# Patient Record
Sex: Female | Born: 1980 | Race: White | Hispanic: No | Marital: Single | Smoking: Current every day smoker
Health system: Southern US, Community
[De-identification: ages and names within clinical notes are randomized; demographics above are authoritative.]

## PROBLEM LIST (undated history)

## (undated) DIAGNOSIS — J45909 Unspecified asthma, uncomplicated: Secondary | ICD-10-CM

## (undated) DIAGNOSIS — F419 Anxiety disorder, unspecified: Secondary | ICD-10-CM

## (undated) DIAGNOSIS — M797 Fibromyalgia: Secondary | ICD-10-CM

## (undated) DIAGNOSIS — G039 Meningitis, unspecified: Secondary | ICD-10-CM

## (undated) DIAGNOSIS — A6920 Lyme disease, unspecified: Secondary | ICD-10-CM

## (undated) HISTORY — DX: Lyme disease, unspecified: A69.20

## (undated) HISTORY — DX: Meningitis, unspecified: G03.9

## (undated) HISTORY — DX: Fibromyalgia: M79.7

## (undated) HISTORY — PX: CHOLECYSTECTOMY: SHX55

---

## 2007-10-09 ENCOUNTER — Emergency Department: Payer: Self-pay | Admitting: Internal Medicine

## 2007-10-09 ENCOUNTER — Other Ambulatory Visit: Payer: Self-pay

## 2009-04-25 DIAGNOSIS — M549 Dorsalgia, unspecified: Secondary | ICD-10-CM | POA: Insufficient documentation

## 2011-05-27 ENCOUNTER — Emergency Department: Payer: Self-pay | Admitting: Emergency Medicine

## 2011-10-23 ENCOUNTER — Ambulatory Visit: Payer: Self-pay | Admitting: Emergency Medicine

## 2011-10-26 ENCOUNTER — Ambulatory Visit: Payer: Self-pay | Admitting: Emergency Medicine

## 2011-10-27 LAB — PATHOLOGY REPORT

## 2012-05-26 ENCOUNTER — Emergency Department: Payer: Self-pay | Admitting: Emergency Medicine

## 2012-05-28 ENCOUNTER — Emergency Department: Payer: Self-pay | Admitting: Unknown Physician Specialty

## 2012-08-10 IMAGING — CT CT ABD-PELV W/ CM
1 of 2 series · 15 of 32 positions shown, 19 images · non-contrast
Comparison: none

REASON FOR EXAM: (1) LLQ pain vomiting, diarrhea; (2) LLQ pain vomiting,
diarrhea;    NOTE: Nursi
COMMENTS:

PROCEDURE:     CT  - CT ABDOMEN / PELVIS  W  - May 28, 2011  [DATE]
RESULT:
TECHNIQUE: Emergent CT of the abdomen and pelvis is performed with 100 ml of
Esovue-01U iodinated intravenous contrast and oral contrast. Images are
reconstructed in the axial plane at 3.0 mm slice thickness.
There is no previous exam for comparison.

[Series 2: 3mm soft tissue · axial · 0.76mm/px · z∈[-872,-449]mm · 15 of 155 slices shown, 19 images]
[im 7/155  soft-tissue]
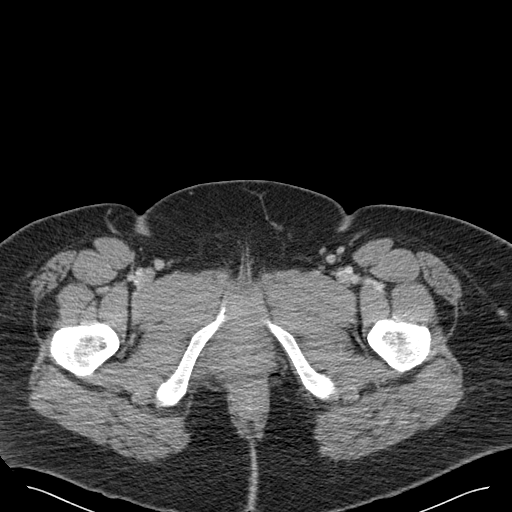
[im 7/155  bone]
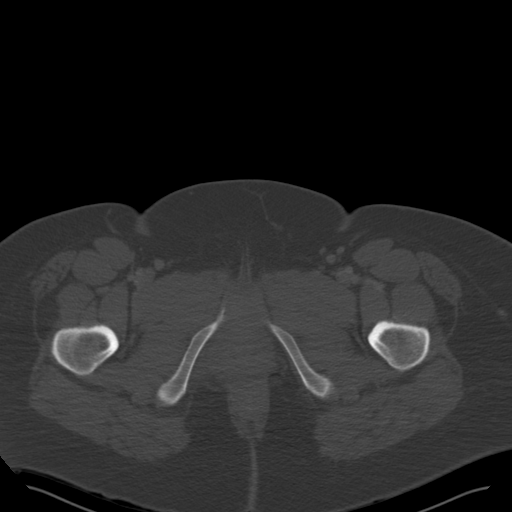
[im 19/155  soft-tissue]
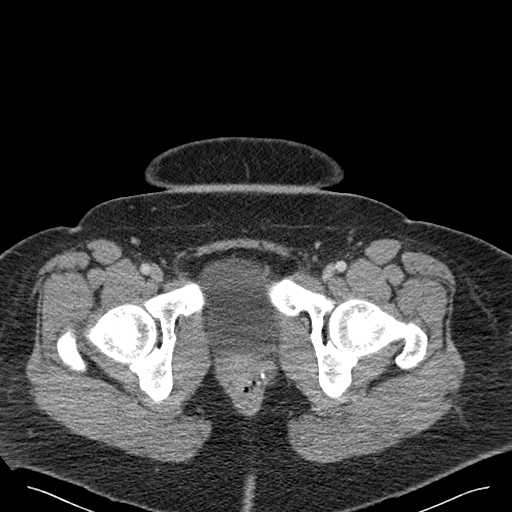
[im 31/155  soft-tissue]
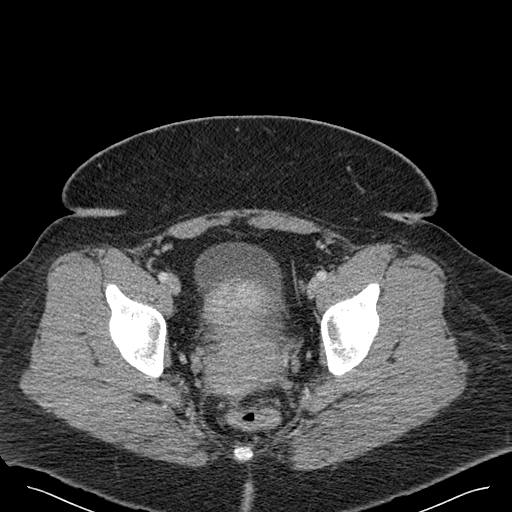
[im 44/155  soft-tissue]
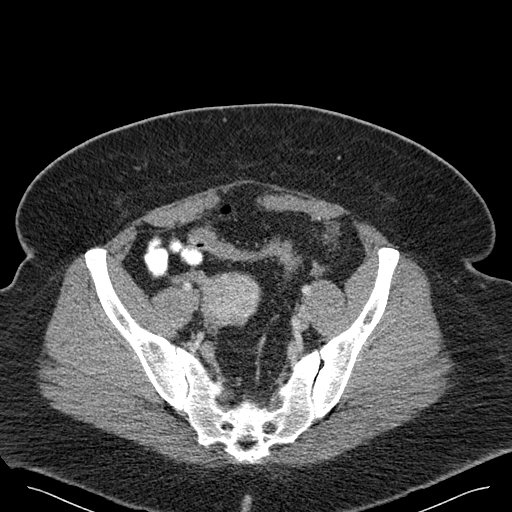
[im 56/155  soft-tissue]
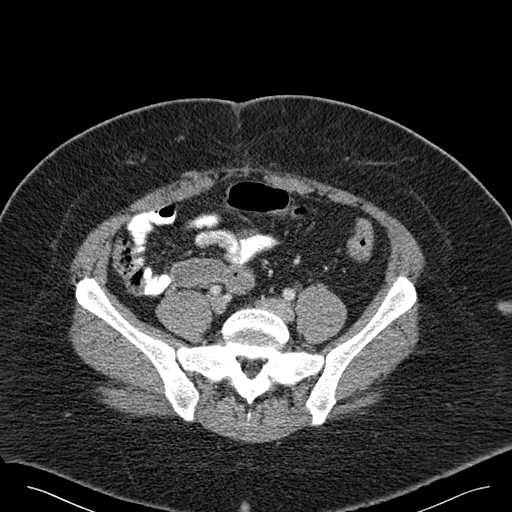
[im 68/155  soft-tissue]
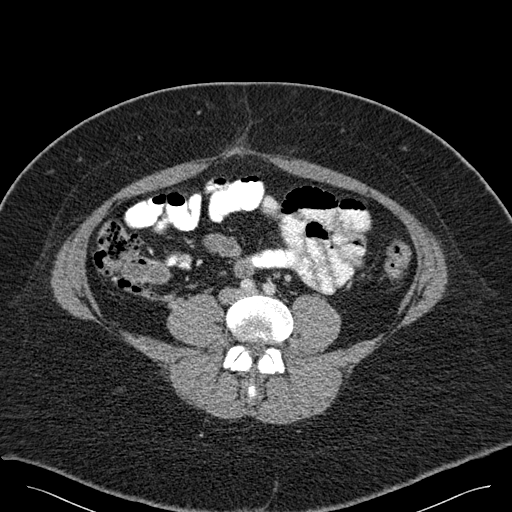
[im 81/155  soft-tissue]
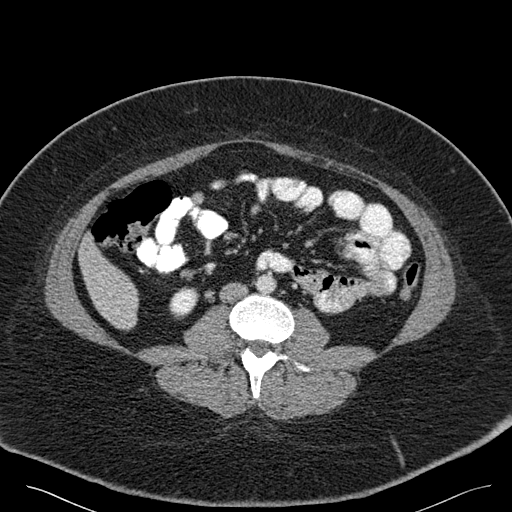
[im 87/155  soft-tissue]
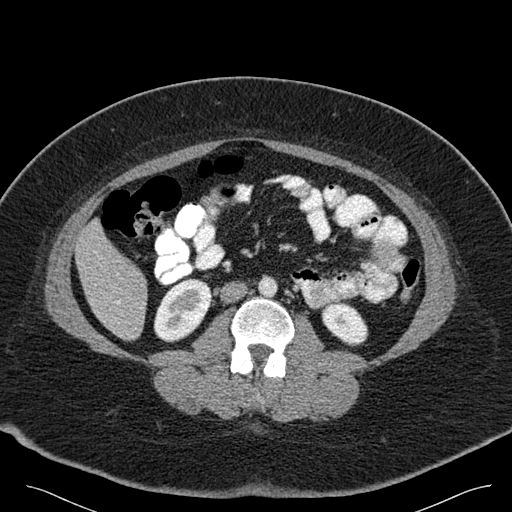
[im 99/155  soft-tissue]
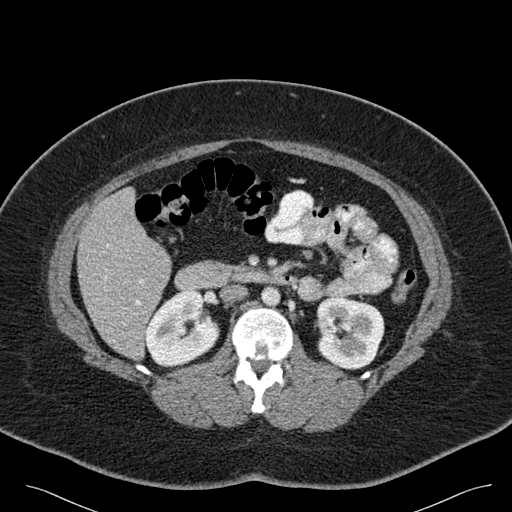
[im 99/155  bone]
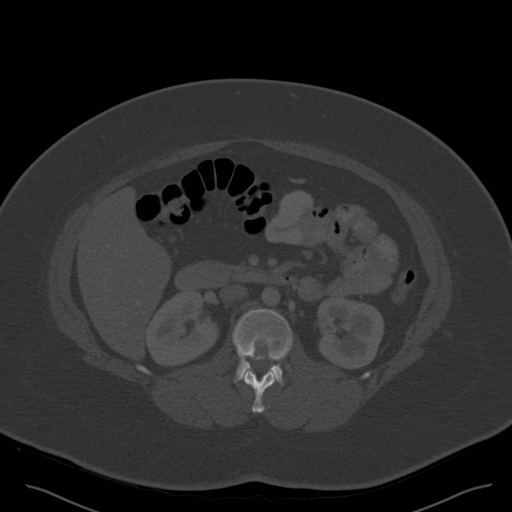
[im 111/155  soft-tissue]
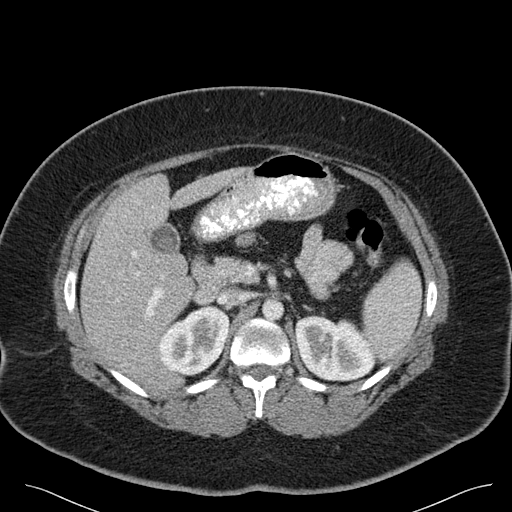
[im 124/155  soft-tissue]
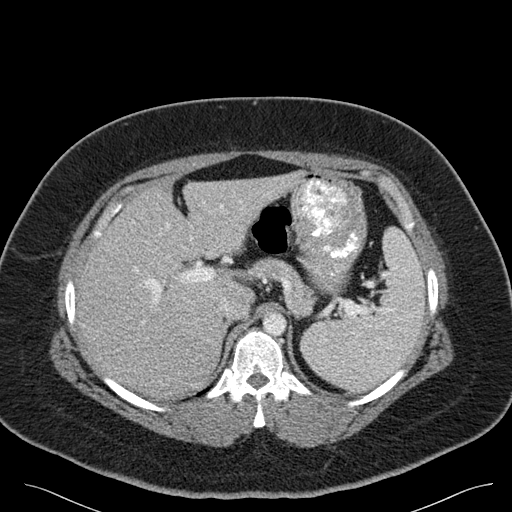
[im 130/155  lung]
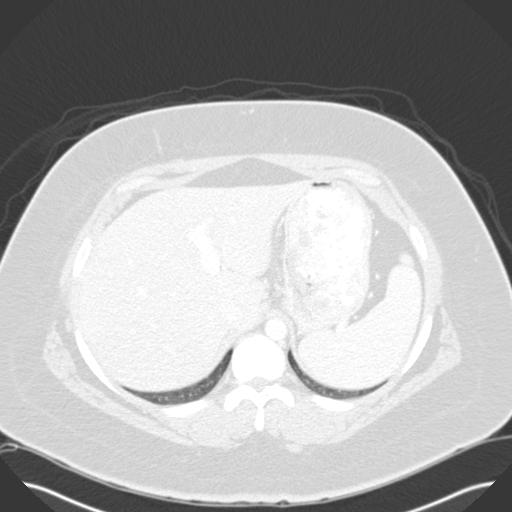
[im 136/155  soft-tissue]
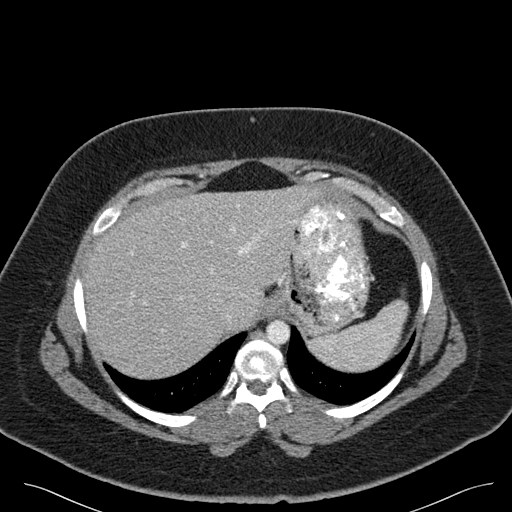
[im 136/155  lung]
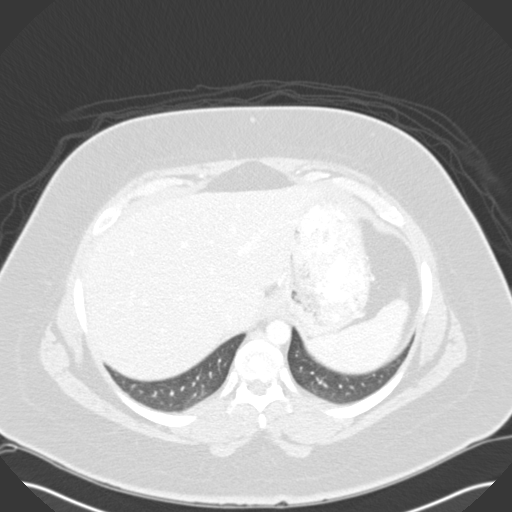
[im 142/155  lung]
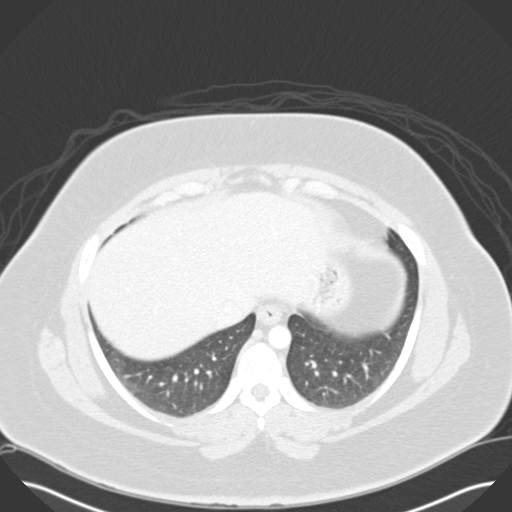
[im 148/155  soft-tissue]
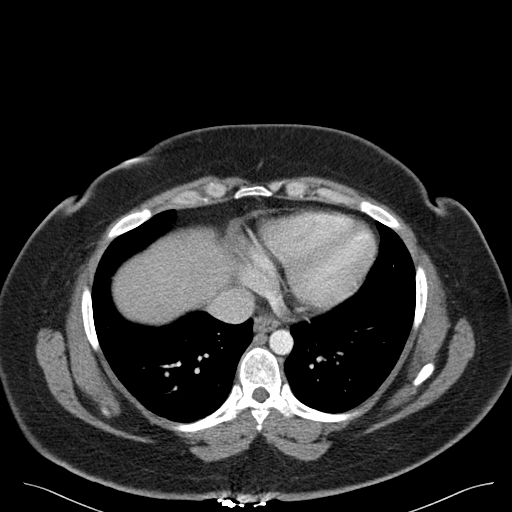
[im 148/155  lung]
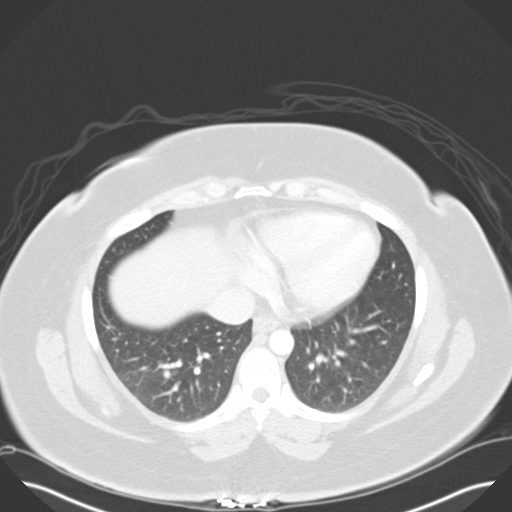

[15 of 32 positions shown; findings below may reference images not displayed]

FINDINGS: The lung bases appear clear. The liver and spleen enhance
homogeneously. Cholelithiasis is noted with what appears to be a large stone
in a somewhat contracted gallbladder. The pancreas, adrenal glands, kidneys
and aorta appear to be unremarkable. The stomach and small bowel appear
within normal limits. There is minimal thickening of the wall of the mid to
distal descending colon which raises the possibility of mild colitis. No
abscess, pneumatosis or perforation is evident. The appendix is
unremarkable. The uterus and urinary bladder appear within normal limits. No
ascites or abscess is appreciated. No adnexal mass is evident.
IMPRESSION: 1.  Mild thickening of the wall of the mid to distal descending colon
suggestive of colitis.
2.  Cholelithiasis.

## 2014-03-27 ENCOUNTER — Other Ambulatory Visit: Payer: Self-pay

## 2014-03-27 ENCOUNTER — Ambulatory Visit: Payer: Self-pay

## 2014-03-27 LAB — COMPREHENSIVE METABOLIC PANEL
ALT: 33 U/L (ref 12–78)
AST: 29 U/L (ref 15–37)
Albumin: 3.7 g/dL (ref 3.4–5.0)
Alkaline Phosphatase: 89 U/L
Anion Gap: 6 — ABNORMAL LOW (ref 7–16)
BILIRUBIN TOTAL: 1 mg/dL (ref 0.2–1.0)
BUN: 11 mg/dL (ref 7–18)
CO2: 24 mmol/L (ref 21–32)
Calcium, Total: 8.3 mg/dL — ABNORMAL LOW (ref 8.5–10.1)
Chloride: 108 mmol/L — ABNORMAL HIGH (ref 98–107)
Creatinine: 0.67 mg/dL (ref 0.60–1.30)
EGFR (African American): >60
EGFR (Non-African Amer.): >60
Glucose: 94 mg/dL (ref 65–99)
Osmolality: 275 (ref 275–301)
Potassium: 3.9 mmol/L (ref 3.5–5.1)
Sodium: 138 mmol/L (ref 136–145)
Total Protein: 7.6 g/dL (ref 6.4–8.2)

## 2014-03-27 LAB — CBC WITH DIFFERENTIAL/PLATELET
BASOS ABS: 0.1 10*3/uL (ref 0.0–0.1)
BASOS PCT: 1 %
EOS PCT: 3.6 %
Eosinophil #: 0.4 10*3/uL (ref 0.0–0.7)
HCT: 40.1 % (ref 35.0–47.0)
HGB: 13.3 g/dL (ref 12.0–16.0)
Lymphocyte #: 2.6 10*3/uL (ref 1.0–3.6)
Lymphocyte %: 24.6 %
MCH: 29.9 pg (ref 26.0–34.0)
MCHC: 33.1 g/dL (ref 32.0–36.0)
MCV: 90 fL (ref 80–100)
Monocyte #: 0.6 x10 3/mm (ref 0.2–0.9)
Monocyte %: 5.4 %
Neutrophil #: 6.9 10*3/uL — ABNORMAL HIGH (ref 1.4–6.5)
Neutrophil %: 65.4 %
PLATELETS: 251 10*3/uL (ref 150–440)
RBC: 4.45 10*6/uL (ref 3.80–5.20)
RDW: 13.4 % (ref 11.5–14.5)
WBC: 10.6 10*3/uL (ref 3.6–11.0)

## 2014-03-27 LAB — TSH: Thyroid Stimulating Horm: 1.16 u[IU]/mL

## 2014-09-29 DIAGNOSIS — K529 Noninfective gastroenteritis and colitis, unspecified: Secondary | ICD-10-CM | POA: Insufficient documentation

## 2014-11-30 DIAGNOSIS — M79601 Pain in right arm: Secondary | ICD-10-CM | POA: Insufficient documentation

## 2014-11-30 DIAGNOSIS — M79602 Pain in left arm: Secondary | ICD-10-CM

## 2014-11-30 DIAGNOSIS — M7542 Impingement syndrome of left shoulder: Secondary | ICD-10-CM | POA: Insufficient documentation

## 2014-11-30 DIAGNOSIS — R202 Paresthesia of skin: Secondary | ICD-10-CM

## 2015-01-26 ENCOUNTER — Ambulatory Visit: Payer: Self-pay | Admitting: Family Medicine

## 2015-06-10 IMAGING — CR DG LUMBAR SPINE 2-3V
1 series · 4 of 4 positions shown · non-contrast
Comparison: None.

CLINICAL DATA: Chronic back pain.

EXAM:
LUMBAR SPINE - 2-3 VIEW

[Series 1: lat · 0.17mm/px · 4 of 4 slices shown]
[im 1/4]
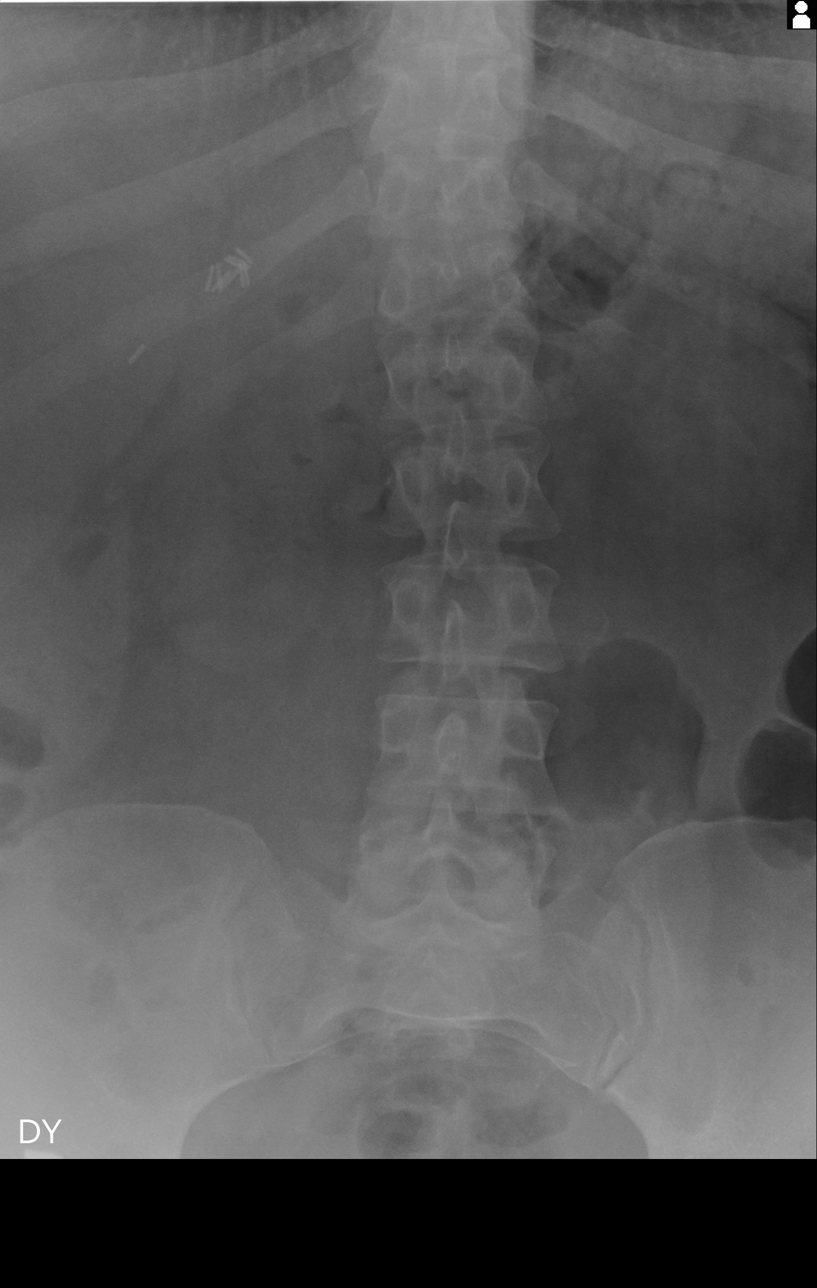
[im 2/4]
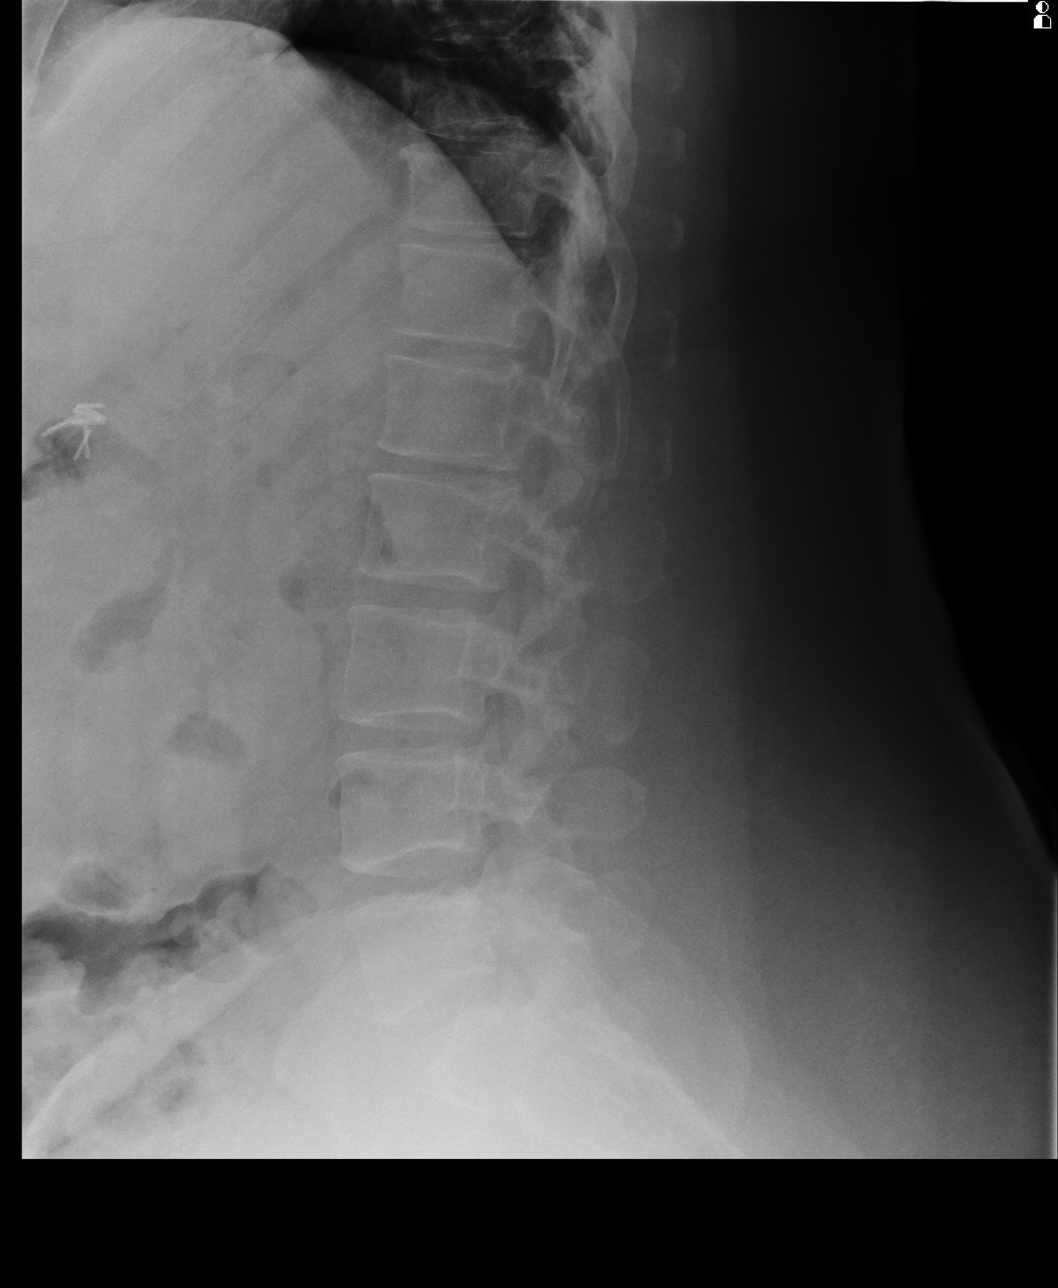
[im 3/4]
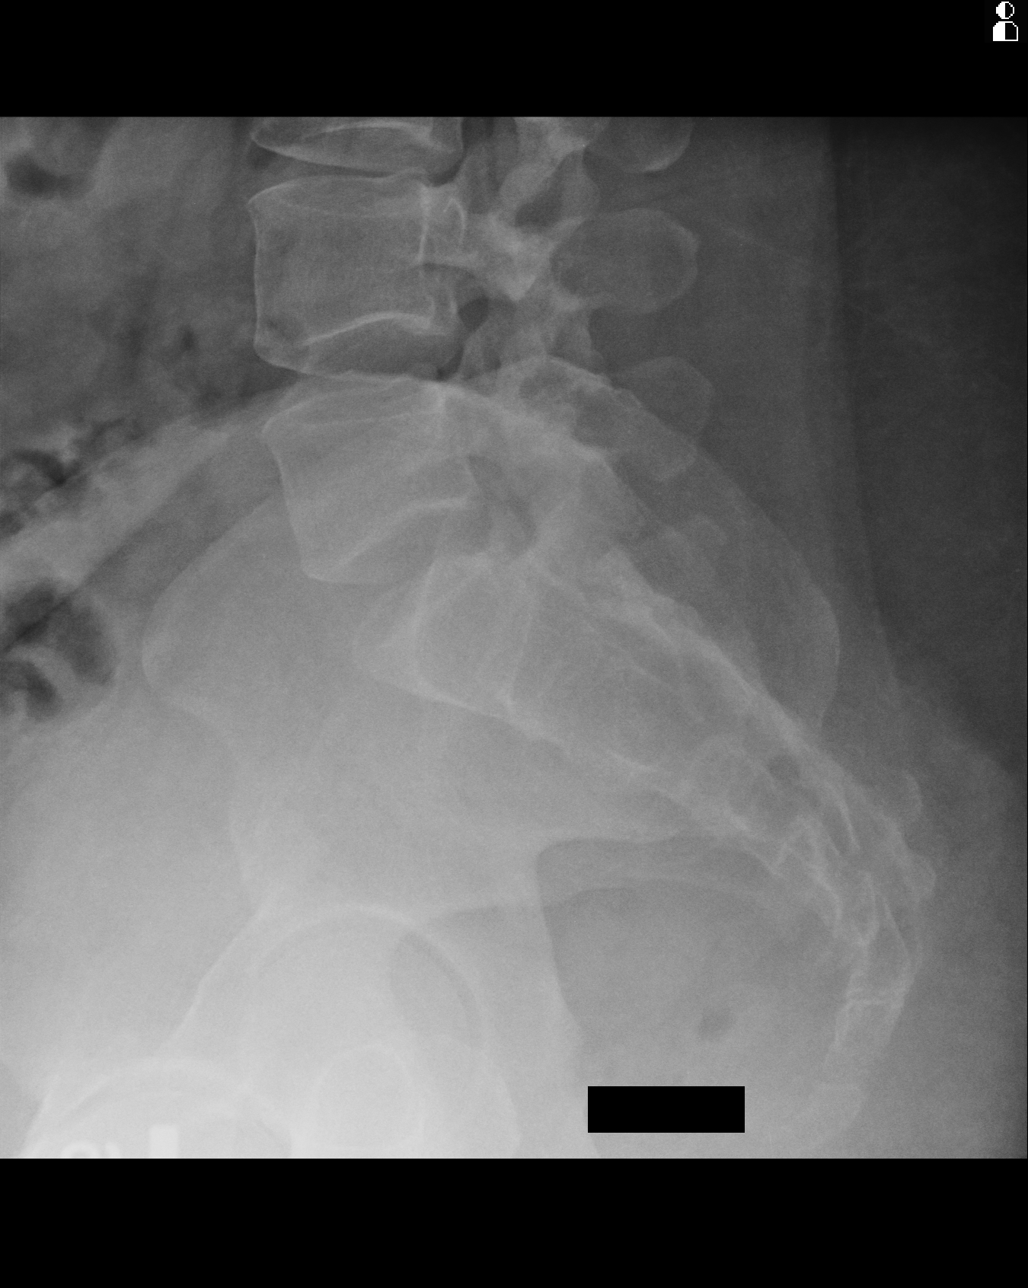
[im 4/4]
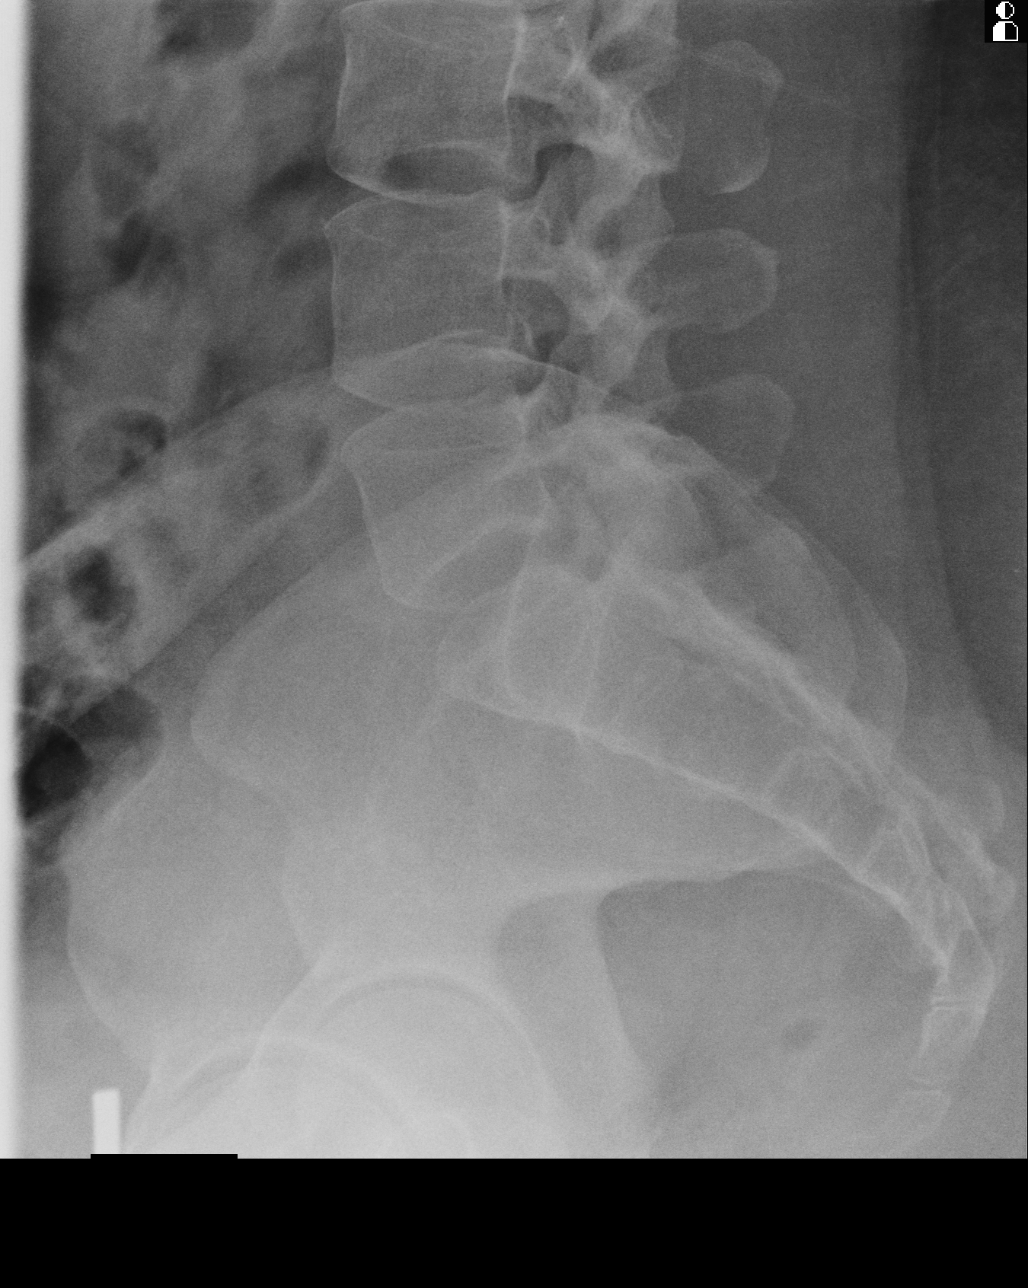

[4 of 4 positions shown; findings below may reference images not displayed]

FINDINGS: No fracture. No spondylolisthesis. There is a mild levoscoliosis
with the apex at L3 there is mild straightening of the normal lumbar
lordosis. Disc spaces are well preserved. There are vascular clips
in the right upper quadrant from a cholecystectomy. The soft tissues
are otherwise unremarkable.
IMPRESSION: Mild levoscoliosis.  No other abnormality.

## 2015-09-26 DIAGNOSIS — G629 Polyneuropathy, unspecified: Secondary | ICD-10-CM | POA: Insufficient documentation

## 2016-02-04 DIAGNOSIS — E782 Mixed hyperlipidemia: Secondary | ICD-10-CM | POA: Insufficient documentation

## 2016-02-04 DIAGNOSIS — R0602 Shortness of breath: Secondary | ICD-10-CM | POA: Insufficient documentation

## 2016-02-04 DIAGNOSIS — R601 Generalized edema: Secondary | ICD-10-CM | POA: Insufficient documentation

## 2016-02-24 DIAGNOSIS — G5600 Carpal tunnel syndrome, unspecified upper limb: Secondary | ICD-10-CM | POA: Insufficient documentation

## 2016-08-08 ENCOUNTER — Ambulatory Visit (INDEPENDENT_AMBULATORY_CARE_PROVIDER_SITE_OTHER): Payer: Medicaid Other | Admitting: Sports Medicine

## 2016-08-08 ENCOUNTER — Encounter: Payer: Self-pay | Admitting: Sports Medicine

## 2016-08-08 DIAGNOSIS — F418 Other specified anxiety disorders: Secondary | ICD-10-CM | POA: Insufficient documentation

## 2016-08-08 DIAGNOSIS — D649 Anemia, unspecified: Secondary | ICD-10-CM | POA: Insufficient documentation

## 2016-08-08 DIAGNOSIS — L600 Ingrowing nail: Secondary | ICD-10-CM | POA: Diagnosis not present

## 2016-08-08 DIAGNOSIS — M79675 Pain in left toe(s): Secondary | ICD-10-CM

## 2016-08-08 DIAGNOSIS — M797 Fibromyalgia: Secondary | ICD-10-CM | POA: Insufficient documentation

## 2016-08-08 DIAGNOSIS — G4719 Other hypersomnia: Secondary | ICD-10-CM | POA: Insufficient documentation

## 2016-08-08 DIAGNOSIS — T7840XA Allergy, unspecified, initial encounter: Secondary | ICD-10-CM | POA: Insufficient documentation

## 2016-08-08 DIAGNOSIS — K219 Gastro-esophageal reflux disease without esophagitis: Secondary | ICD-10-CM | POA: Insufficient documentation

## 2016-08-08 DIAGNOSIS — M79674 Pain in right toe(s): Secondary | ICD-10-CM

## 2016-08-08 DIAGNOSIS — J45909 Unspecified asthma, uncomplicated: Secondary | ICD-10-CM | POA: Insufficient documentation

## 2016-08-08 DIAGNOSIS — F5104 Psychophysiologic insomnia: Secondary | ICD-10-CM | POA: Insufficient documentation

## 2016-08-08 MED ORDER — AMOXICILLIN-POT CLAVULANATE 875-125 MG PO TABS: 1.0000 | ORAL_TABLET | Freq: Two times a day (BID) | ORAL | 0 refills | Status: DC

## 2016-08-08 NOTE — Progress Notes (Signed)
Subjective: Kathrine CordsLeslie N Grzesiak is a 35 y.o. female patient presents to office today complaining of a painful incurvated, swollen lateral nail borders of the 1st toe on the right and left foot. This has been present for years. Had them cut out by PCP. Patient has treated this by peroxide. Admits to episode of puss. Patient denies fever/chills/nausea/vomitting/any other related constitutional symptoms at this time.  Patient Active Problem List   Diagnosis Date Noted  . Allergic state 08/08/2016  . Anemia 08/08/2016  . Asthma without status asthmaticus 08/08/2016  . Chronic insomnia 08/08/2016  . Depression with anxiety 08/08/2016  . Excessive daytime sleepiness 08/08/2016  . Fibromyalgia 08/08/2016  . GERD (gastroesophageal reflux disease) 08/08/2016  . Generalized edema 02/04/2016  . Mixed hyperlipidemia 02/04/2016  . Shortness of breath 02/04/2016  . Paresthesia and pain of both upper extremities 11/30/2014  . Rotator cuff impingement syndrome of left shoulder 11/30/2014  . Chronic diarrhea 09/29/2014    No current outpatient prescriptions on file prior to visit.   No current facility-administered medications on file prior to visit.     Allergies  Allergen Reactions  . Duloxetine Nausea And Vomiting  . Codeine Rash    Objective:  There were no vitals filed for this visit.  General: Well developed, nourished, in no acute distress, alert and oriented x3   Dermatology: Skin is warm, dry and supple bilateral. Right and left hallux nail appears to be severely incurvated with hyperkeratosis formation at the distal aspects of the lateral nail borders. (-) Erythema. (+) Edema. (-) serosanguous drainage present. The remaining nails appear unremarkable at this time. There are no open sores, lesions or other signs of infection  present.  Vascular: Dorsalis Pedis artery and Posterior Tibial artery pedal pulses are 2/4 bilateral with immedate capillary fill time. Pedal hair growth present.  No lower extremity edema.   Neruologic: Grossly intact via light touch bilateral.  Musculoskeletal: Tenderness to palpation of the Right and left hallux lateral nail fold(s). Muscular strength within normal limits in all groups bilateral.   Assesement and Plan: Problem List Items Addressed This Visit    None    Visit Diagnoses    Ingrown nail    -  Primary   Bilateral hallux lateral margin   Toe pain, bilateral          -Discussed treatment alternatives and plan of care; Explained permanent/temporary nail avulsion and post procedure course to patient. Patient opts for PNA bilateral.  - After a verbal consent, injected 3 ml of a 50:50 mixture of 2% plain  lidocaine and 0.5% plain marcaine in a normal hallux block fashion. Next, a  betadine prep was performed. Anesthesia was tested and found to be appropriate.  The offending right and left lateral nail borders were then incised from the hyponychium to the epinychium. The offending nail border was removed and cleared from the field. The area was curretted for any remaining nail or spicules. Phenol application performed and the area was then flushed with alcohol and dressed with antibiotic cream and a dry sterile dressing. -Patient was instructed to leave the dressing intact for today and begin soaking in a weak solution of betadine and water tomorrow. Patient was instructed to soak for 15 minutes each day and apply neosporin and a gauze or bandaid dressing each day. -Rx Augmentin for preventive measurers -Patient was instructed to monitor the toes for signs of infection and return to office if toe becomes red, hot or swollen. -Advised ice, elevation, and tylenol  or motrin if needed for pain.   -Patient is to return as needed or sooner if problems arise.  Asencion Islamitorya Cherryl Babin, DPM

## 2016-08-08 NOTE — Patient Instructions (Signed)

## 2016-09-25 DIAGNOSIS — R200 Anesthesia of skin: Secondary | ICD-10-CM | POA: Insufficient documentation

## 2017-04-25 DIAGNOSIS — R079 Chest pain, unspecified: Secondary | ICD-10-CM | POA: Insufficient documentation

## 2018-01-27 ENCOUNTER — Emergency Department
Admission: EM | Admit: 2018-01-27 | Discharge: 2018-01-27 | Disposition: A | Discharge status: Home / Self Care | Payer: Medicaid Other | Attending: Emergency Medicine | Admitting: Emergency Medicine

## 2018-01-27 ENCOUNTER — Emergency Department: Payer: Medicaid Other

## 2018-01-27 ENCOUNTER — Encounter: Payer: Self-pay | Admitting: Emergency Medicine

## 2018-01-27 DIAGNOSIS — F17290 Nicotine dependence, other tobacco product, uncomplicated: Secondary | ICD-10-CM | POA: Insufficient documentation

## 2018-01-27 DIAGNOSIS — F419 Anxiety disorder, unspecified: Secondary | ICD-10-CM | POA: Insufficient documentation

## 2018-01-27 DIAGNOSIS — R05 Cough: Secondary | ICD-10-CM | POA: Diagnosis present

## 2018-01-27 DIAGNOSIS — F329 Major depressive disorder, single episode, unspecified: Secondary | ICD-10-CM | POA: Diagnosis not present

## 2018-01-27 DIAGNOSIS — Z9049 Acquired absence of other specified parts of digestive tract: Secondary | ICD-10-CM | POA: Diagnosis not present

## 2018-01-27 DIAGNOSIS — R0789 Other chest pain: Secondary | ICD-10-CM | POA: Insufficient documentation

## 2018-01-27 DIAGNOSIS — J40 Bronchitis, not specified as acute or chronic: Secondary | ICD-10-CM | POA: Diagnosis not present

## 2018-01-27 HISTORY — DX: Anxiety disorder, unspecified: F41.9

## 2018-01-27 HISTORY — DX: Unspecified asthma, uncomplicated: J45.909

## 2018-01-27 HISTORY — DX: Fibromyalgia: M79.7

## 2018-01-27 LAB — CBC
HCT: 44.6 % (ref 35.0–47.0)
Hemoglobin: 15 g/dL (ref 12.0–16.0)
MCH: 30.2 pg (ref 26.0–34.0)
MCHC: 33.6 g/dL (ref 32.0–36.0)
MCV: 89.8 fL (ref 80.0–100.0)
PLATELETS: 258 10*3/uL (ref 150–440)
RBC: 4.97 MIL/uL (ref 3.80–5.20)
RDW: 13.4 % (ref 11.5–14.5)
WBC: 7.6 10*3/uL (ref 3.6–11.0)

## 2018-01-27 LAB — BASIC METABOLIC PANEL
Anion gap: 9 (ref 5–15)
BUN: 12 mg/dL (ref 6–20)
CALCIUM: 9.2 mg/dL (ref 8.9–10.3)
CHLORIDE: 105 mmol/L (ref 101–111)
CO2: 26 mmol/L (ref 22–32)
CREATININE: 0.91 mg/dL (ref 0.44–1.00)
Glucose, Bld: 97 mg/dL (ref 65–99)
Potassium: 4.1 mmol/L (ref 3.5–5.1)
SODIUM: 140 mmol/L (ref 135–145)

## 2018-01-27 LAB — TROPONIN I

## 2018-01-27 MED ORDER — AZITHROMYCIN 250 MG PO TABS: ORAL_TABLET | ORAL | 0 refills | Status: AC

## 2018-01-27 MED ORDER — ALBUTEROL SULFATE HFA 108 (90 BASE) MCG/ACT IN AERS: 2.0000 | INHALATION_SPRAY | Freq: Four times a day (QID) | RESPIRATORY_TRACT | 2 refills | Status: AC | PRN

## 2018-01-27 NOTE — ED Notes (Signed)

## 2018-01-27 NOTE — Discharge Instructions (Signed)
use the inhaler 2 puffs 4 times a day for the next few days. Take Zithromax as directed. Return for high fever shortness of breath worse pain or feeling sicker. Follow-up with your doctor within a week. He

## 2018-01-27 NOTE — ED Triage Notes (Signed)
Pt reports chest pain to right side of chest, cough, congestion, wheezing.

## 2018-01-27 NOTE — ED Provider Notes (Signed)
[facility] Emergency Department Provider Note   ____________________________________________   First MD Initiated Contact with Patient 01/27/18 1230     (approximate)  I have reviewed the triage vital signs and the nursing notes.   HISTORY  Chief Complaint Chest Pain    HPI Karen Santos is a 37 y.o. female Patient reports 4 days of cough congestion/sputum production which is green or yellow. She reports when she coughs now she has pain in the right side of her chest with coughing or deep breathing. This came on after she been coughing for a day or 2. Palpation of the chest exactly reproduces the pain. She is a former smoker who is now very pink. She has had an inhaler in the past.pain is sharp and again made worse with deep breathing or coughing. Moderate in intensity   Past Medical History:  Diagnosis Date  . Anxiety   . Asthma   . Fibromyalgia     Patient Active Problem List   Diagnosis Date Noted  . Allergic state 08/08/2016  . Anemia 08/08/2016  . Asthma without status asthmaticus 08/08/2016  . Chronic insomnia 08/08/2016  . Depression with anxiety 08/08/2016  . Excessive daytime sleepiness 08/08/2016  . Fibromyalgia 08/08/2016  . GERD (gastroesophageal reflux disease) 08/08/2016  . Generalized edema 02/04/2016  . Mixed hyperlipidemia 02/04/2016  . Shortness of breath 02/04/2016  . Paresthesia and pain of both upper extremities 11/30/2014  . Rotator cuff impingement syndrome of left shoulder 11/30/2014  . Chronic diarrhea 09/29/2014    Past Surgical History:  Procedure Laterality Date  . CESAREAN SECTION     x2  . CHOLECYSTECTOMY      Prior to Admission medications   Medication Sig Start Date End Date Taking? Authorizing Provider  albuterol (PROVENTIL HFA;VENTOLIN HFA) 108 (90 Base) MCG/ACT inhaler Inhale 2 puffs into the lungs every 6 (six) hours as needed for wheezing or shortness of breath. 01/27/18   Arnaldo Natal, MD    azithromycin (ZITHROMAX Z-PAK) 250 MG tablet Take 2 tablets (500 mg) on  Day 1,  followed by 1 tablet (250 mg) once daily on Days 2 through 5. 01/27/18 02/01/18  Arnaldo Natal, MD    Allergies Duloxetine and Codeine  History reviewed. No pertinent family history.  Social History Social History   Tobacco Use  . Smoking status: Current Every Day Smoker    Types: E-cigarettes  . Smokeless tobacco: Never Used  Substance Use Topics  . Alcohol use: No    Frequency: Never  . Drug use: No    Review of Systems  Constitutional: No fever/chills Eyes: No visual changes. ENT: No sore throat. Cardiovascular:see history of present illness Respiratory: Denies shortness of breath. Gastrointestinal: No abdominal pain.  No nausea, no vomiting.  No diarrhea.  No constipation. Genitourinary: Negative for dysuria. Musculoskeletal: Negative for back pain. Skin: Negative for rash. Neurological: Negative for headaches, focal weakness  ____________________________________________   PHYSICAL EXAM:  VITAL SIGNS: ED Triage Vitals  Enc Vitals Group     BP 01/27/18 1135 130/73     Pulse Rate 01/27/18 1135 66     Resp 01/27/18 1135 20     Temp 01/27/18 1134 98.2 F (36.8 C)     Temp Source 01/27/18 1134 Oral     SpO2 01/27/18 1135 99 %     Weight 01/27/18 1134 280 lb (127 kg)     Height 01/27/18 1134 5\' 3"  (1.6 m)     Head Circumference --  Peak Flow --      Pain Score 01/27/18 1134 4     Pain Loc --      Pain Edu? --      Excl. in GC? --     Constitutional: Alert and oriented. Well appearing and in no acute distress. Eyes: Conjunctivae are normal.  Head: Atraumatic. Nose: No congestion/rhinnorhea. Mouth/Throat: Mucous membranes are moist.  Oropharynx non-erythematous. Neck: No stridor.   Cardiovascular: Normal rate, regular rhythm. Grossly normal heart sounds.  Good peripheral circulation. Respiratory: Normal respiratory effort.  No retractions. Lungs CTAB.palpation of the  chest just to the right of the manubrium exactly reproduces her pain. Gastrointestinal: Soft and nontender. No distention. No abdominal bruits. No CVA tenderness. Musculoskeletal: No lower extremity tenderness nor edema.  No joint effusions. Neurologic:  Normal speech and language. No gross focal neurologic deficits are appreciated. No gait instability. Skin:  Skin is warm, dry and intact. No rash noted. Psychiatric: Mood and affect are normal. Speech and behavior are normal.  ____________________________________________   LABS (all labs ordered are listed, but only abnormal results are displayed)  Labs Reviewed  BASIC METABOLIC PANEL  CBC  TROPONIN I  POC URINE PREG, ED   ____________________________________________  EKG  EKG read and interpreted by me shows normal sinus rhythm rate of 68 normal axis normal except for some slight PR segment depression in leads 1 and 2. ____________________________________________  RADIOLOGY  ED MD interpretation:no pneumonia.  Official radiology report(s): Dg Chest 2 View  Result Date: 01/27/2018 CLINICAL DATA:  Central chest pain, shortness of breath and productive cough. EXAM: CHEST  2 VIEW COMPARISON:  None. FINDINGS: The heart size and mediastinal contours are within normal limits. There is some bronchial thickening bilaterally predominantly in a perihilar distribution, right greater than left. There may be some mucous plugging on the right. There is no evidence of pulmonary edema, focal airspace consolidation, pneumothorax, nodule or pleural fluid. The visualized skeletal structures are unremarkable. IMPRESSION: Bronchial thickening bilaterally in a perihilar distribution, slightly more prominently on the right side. There may be some associated mucous plugging. Electronically Signed   By: Irish LackGlenn  Yamagata M.D.   On: 01/27/2018 12:06    ____________________________________________   PROCEDURES  Procedure(s) performed:    Procedures  Critical Care performed:   ____________________________________________   INITIAL IMPRESSION / ASSESSMENT AND PLAN / ED COURSE  patient asked for work note. Patient does have at least bronchitis. Possible early COPD as she was a smoker. She did have a history of using an inhaler. I will give her some antibiotics for her colored sputum and bad cough and an inhaler.     Clinical Course as of Jan 27 1253  Sun Jan 27, 2018  1230 Potassium: 4.1 [PM]    Clinical Course User Index [PM] Arnaldo NatalMalinda, Malesha Suliman F, MD     ____________________________________________   FINAL CLINICAL IMPRESSION(S) / ED DIAGNOSES  Final diagnoses:  Chest wall pain  Bronchitis     ED Discharge Orders        Ordered    albuterol (PROVENTIL HFA;VENTOLIN HFA) 108 (90 Base) MCG/ACT inhaler  Every 6 hours PRN     01/27/18 1254    azithromycin (ZITHROMAX Z-PAK) 250 MG tablet     01/27/18 1254       Note:  This document was prepared using Dragon voice recognition software and may include unintentional dictation errors.    Arnaldo NatalMalinda, Gearld Kerstein F, MD 01/27/18 1254

## 2018-07-21 ENCOUNTER — Emergency Department
Admission: EM | Admit: 2018-07-21 | Discharge: 2018-07-21 | Disposition: A | Discharge status: Home / Self Care | Payer: Medicaid - Out of State | Attending: Emergency Medicine | Admitting: Emergency Medicine

## 2018-07-21 DIAGNOSIS — T7840XA Allergy, unspecified, initial encounter: Secondary | ICD-10-CM | POA: Insufficient documentation

## 2018-07-21 MED ORDER — PREDNISONE 20 MG PO TABS
60.00 mg | ORAL_TABLET | Freq: Once | ORAL | Status: AC
Start: 2018-07-21 — End: 2018-07-21
  Administered 2018-07-21: 18:00:00 60 mg via ORAL

## 2018-07-21 MED ORDER — PREDNISONE 20 MG PO TABS
ORAL_TABLET | ORAL | Status: AC
Start: 2018-07-21 — End: ?
  Filled 2018-07-21: qty 3

## 2018-07-21 MED ORDER — PREDNISONE 10 MG PO TABS
ORAL_TABLET | ORAL | 0 refills | Status: AC
Start: 2018-07-21 — End: ?

## 2018-07-21 NOTE — ED Provider Notes (Signed)
[facility]  EMERGENCY DEPARTMENT  History and Physical Exam     Patient Name: Natalie Mcpherson, Natalie Mcpherson  Encounter Date:  07/21/2018  Attending Physician: Caralyn Guile. Rachel Moulds, M.D.  Room:  E58/E58-A  Patient DOB:  [birth year]  Age: 37 y.o. female  MRN:  [id number]  PCP: No primary care provider on file.      Diagnosis/Disposition:  MDM:     Final Impression  1. Acute allergic reaction, initial encounter        Disposition  ED Disposition     ED Disposition Condition Date/Time Comment    Discharge  Sun Jul 21, 2018  5:57 PM Senaida Lange discharge to home/self care.    Condition at disposition: Stable          Follow up  [facility] Emergency Department  [address]  [place] [address]  [phone]    If symptoms worsen      Prescriptions  New Prescriptions    PREDNISONE (DELTASONE) 10 MG TABLET    Take 5 pills on day one, take 4 pills on day 2, take 3 pills on day 3, take 2 pills on day 4, take one pill on day 5.         There is no evidence for acute anaphylaxis.  The patient will be started on prednisone.          The results of diagnostic studies have been reviewed by myself. Available past medical, family, social, and surgical histories have been reviewed by myself. The clinical impression and plan have been discussed with the patient and/or the patient's family. All questions have been answered.      History of Presenting Illness:     Chief complaint: Allergic Reaction      Natalie Mcpherson is a 37 y.o. female presenting with facial itching, swelling in face, chest tightness, SOB, redness and drainage of the left eye. The patient denies fever or chills.         Review of Systems:  Physical Exam:     Review of Systems   Constitutional: Negative for chills and fever.   HENT: Positive for facial swelling.    Eyes: Positive for discharge and redness.   Respiratory: Positive for chest tightness and shortness of breath.      Blood pressure 114/74, pulse 96, temperature 98 F (36.7 C), temperature source Oral, resp.  rate 20, height 1.6 m, weight 115.1 kg, last menstrual period 06/20/2018, SpO2 98 %.    Physical Exam   Constitutional: She is oriented to person, place, and time. She appears well-developed and well-nourished.   HENT:   Head: Normocephalic and atraumatic.   Mouth/Throat: Oropharynx is clear and moist. No oropharyngeal exudate.   Eyes: Pupils are equal, round, and reactive to light. EOM are normal. Left eye exhibits discharge.   Neck: Trachea normal and normal range of motion. Neck supple. No JVD present.   Cardiovascular: Normal rate, regular rhythm, normal heart sounds and normal pulses.    Pulmonary/Chest: Effort normal and breath sounds normal.   Abdominal: Soft. Bowel sounds are normal. There is no splenomegaly or hepatomegaly. There is no tenderness. There is no rigidity, no rebound and no guarding.   Musculoskeletal: Normal range of motion.   Neurological: She is alert and oriented to person, place, and time. She has normal strength. No cranial nerve deficit or sensory deficit.   Skin: Skin is warm, dry and intact.   Psychiatric: She has a normal mood and affect. Her speech is normal. She  is not agitated.   Nursing note and vitals reviewed.         Allergies & Medications:     Pt is allergic to codeine; cymbalta [duloxetine hcl]; and erythromycin.    Current/Home Medications    No medications on file         Past History:     Medical: Pt has no past medical history on file.    Surgical: Pt  has no past surgical history on file.    Family: The family history is not on file.    Social: Pt has no tobacco, alcohol, and drug history on file.        Diagnostic Results:     Radiologic Studies  No results found.    Lab Studies  Labs Reviewed - No data to display      Procedure/EKG:             ATTESTATIONS     Donna Silverman D. Rachel Moulds, M.D.    The documentation recorded by my scribe, Rometta Emery, accurately reflects the services I personally performed and the decisions that I made. Nicholes Stairs, MD              Tessie Fass, MD  07/21/18 [phone]

## 2018-07-21 NOTE — Discharge Instructions (Signed)
Allergic Reaction     You have been seen for an allergic reaction.     An allergic reaction is when your body reacts to something it comes in contact with. This can be something you ate. It can also be something that got on your skin or that you breathed in. Insect bites sometimes cause this reaction. Wasp, hornet and bee stings often cause this reaction.     Allergic reactions can cause a few things to happen. Some people get hives (large raised welts). Others get blistered skin. More serious reactions include swelling of the lips and/or tongue. They also include difficulty breathing. This can be with or without wheezing.     Often, the exact cause of the allergic reaction is never found.     If you find you are allergic to something, avoid it. Future allergic reactions could get much worse.     General treatment for an allergic reaction includes antihistamines like diphenhydramine (Benadryl®) or prescription strength hydroxyzine (Atarax®) and steroids. Some antacids also act like antihistamines. These include famotidine (Pepcid®), ranitidine (Zantac®) or cimetidine (Tagamet®). These are often used to help with the allergic reaction.     If you get a steroid prescription, it is important to finish the entire prescription. The allergic reaction can come back suddenly (rebound) if you suddenly stop the steroid too early.     YOU SHOULD SEEK MEDICAL ATTENTION IMMEDIATELY, EITHER HERE OR AT THE NEAREST EMERGENCY DEPARTMENT, IF ANY OF THE FOLLOWING OCCURS:  · Your lips or tongue get swollen.  · You have trouble breathing or start wheezing.  · Your rash seems to get infected. Signs of infection are skin redness, pain, pus, swelling or fever (temperature higher than 100.4ºF / 38ºC).

## 2018-11-08 ENCOUNTER — Emergency Department: Payer: Medicaid - Out of State

## 2018-11-08 ENCOUNTER — Emergency Department
Admission: EM | Admit: 2018-11-08 | Discharge: 2018-11-08 | Disposition: A | Discharge status: Home / Self Care | Payer: Medicaid - Out of State | Attending: Family Medicine | Admitting: Family Medicine

## 2018-11-08 DIAGNOSIS — M545 Low back pain, unspecified: Secondary | ICD-10-CM

## 2018-11-08 HISTORY — DX: Meningitis, unspecified: G03.9

## 2018-11-08 HISTORY — DX: Fibromyalgia: M79.7

## 2018-11-08 HISTORY — DX: Lyme disease, unspecified: A69.20

## 2018-11-08 LAB — VH URINALYSIS WITH MICROSCOPIC AND CULTURE IF INDICATED
Bilirubin, UA: NEGATIVE mg/dL
Glucose, UA: NEGATIVE mg/dL
Ketones UA: NEGATIVE mg/dL
Leukocyte Esterase, UA: NEGATIVE Leu/uL
Nitrite, UA: NEGATIVE
Protein, UR: NEGATIVE mg/dL
Urine Specific Gravity: 1.025 (ref 1.001–1.040)
Urobilinogen, UA: 0.2 mg/dL
WBC, UA: NONE SEEN /hpf
pH, Urine: 5.5 pH (ref 5.0–8.0)

## 2018-11-08 LAB — URINE HCG QUALITATIVE: Urine HCG Qualitative: NEGATIVE

## 2018-11-08 MED ORDER — HYDROCODONE-ACETAMINOPHEN 5-325 MG PO TABS
1.00 | ORAL_TABLET | Freq: Once | ORAL | Status: AC
Start: 2018-11-08 — End: 2018-11-08
  Administered 2018-11-08: 21:00:00 1 via ORAL

## 2018-11-08 MED ORDER — HYDROCODONE-ACETAMINOPHEN 5-325 MG PO TABS
ORAL_TABLET | ORAL | Status: AC
Start: 2018-11-08 — End: ?
  Filled 2018-11-08: qty 1

## 2018-11-08 NOTE — ED Provider Notes (Signed)
EMERGENCY DEPARTMENT HISTORY AND PHYSICAL EXAM    Date: 11/08/18  Patient Name: Patrie,Eulogia  Attending Physician: Alger Simons, MD  Patient DOB:  [birth year]  MRN:  [id number]  Room:  E6/ED6-A      History of Presenting Illness     Chief Complaint: Back Pain, Left sided     HPI/ROS is limited by: none  HPI/ROS given by: patient and family       Context: Rilda Bulls is a 37 y.o. female who presents with having Left sided back pain that began 2 days ago. No urinary symptoms. Moving makes it worse   Location: Left lower back  Severity: moderate  Duration: 2 days   Quality: aching   Associated Signs/ Symptoms: back pain  Exacerbation/Mitigating factors: moving makes it worse       PMD: Pcp, None, MD    Past Medical History     Past Medical History:   Diagnosis Date   . Fibromyalgia    . Lyme disease    . Meningitis spinal        Past Surgical History     Past Surgical History:   Procedure Laterality Date   . CESAREAN SECTION     . CHOLECYSTECTOMY     . TONSILLECTOMY         Family History     No family history on file.    Social History     Social History     Socioeconomic History   . Marital status: Divorced     Spouse name: Not on file   . Number of children: Not on file   . Years of education: Not on file   . Highest education level: Not on file   Occupational History   . Not on file   Social Needs   . Financial resource strain: Not on file   . Food insecurity:     Worry: Not on file     Inability: Not on file   . Transportation needs:     Medical: Not on file     Non-medical: Not on file   Tobacco Use   . Smoking status: Never Smoker   . Smokeless tobacco: Never Used   Substance and Sexual Activity   . Alcohol use: No   . Drug use: No   . Sexual activity: Not on file   Lifestyle   . Physical activity:     Days per week: Not on file     Minutes per session: Not on file   . Stress: Not on file   Relationships   . Social connections:     Talks on phone: Not on file     Gets together: Not on file     Attends  religious service: Not on file     Active member of club or organization: Not on file     Attends meetings of clubs or organizations: Not on file     Relationship status: Not on file   . Intimate partner violence:     Fear of current or ex partner: Not on file     Emotionally abused: Not on file     Physically abused: Not on file     Forced sexual activity: Not on file   Other Topics Concern   . Not on file   Social History Narrative   . Not on file       Allergies     Allergies   Allergen Reactions   .  Codeine    . Cymbalta [Duloxetine Hcl]    . Erythromycin    . Shellfish-Derived Products Rash       Home Medications     Prior to Admission medications    Medication Sig Start Date End Date Taking? Authorizing Provider   albuterol (PROVENTIL) (2.5 MG/3ML) 0.083% nebulizer solution Take 2.5 mg by nebulization every 6 (six) hours as needed for Wheezing   Yes [provider]   diphenhydrAMINE (BENADRYL) 25 MG tablet Take 25 mg by mouth every 6 (six) hours as needed    [provider]   predniSONE (DELTASONE) 10 MG tablet Take 5 pills on day one, take 4 pills on day 2, take 3 pills on day 3, take 2 pills on day 4, take one pill on day 5. 07/21/18   Tessie Fass, MD       ED Medications Administered     ED Medication Orders (From admission, onward)    Start Ordered     Status Ordering Provider    11/08/18 2107 11/08/18 2106  HYDROcodone-acetaminophen (NORCO) 5-325 MG per tablet 1 tablet  Once in ED     Route: Oral  Ordered Dose: 1 tablet     Last MAR action:  Given Marsden Zaino J II            Review of Systems     Review of Systems   Constitutional: Negative for chills and fever.   HENT: Negative for ear pain and sore throat.    Eyes: Negative for discharge and redness.   Respiratory: Negative for cough and shortness of breath.    Cardiovascular: Negative for chest pain and palpitations.   Gastrointestinal: Negative for abdominal pain, diarrhea, nausea and vomiting.   Genitourinary: Negative for  dysuria and urgency.   Musculoskeletal: Positive for back pain.   Skin: Negative for rash.   Neurological: Negative for seizures, loss of consciousness and headaches.   Endo/Heme/Allergies: Does not bruise/bleed easily.   All other systems reviewed and are negative.        Physical Exam     Physical Exam   Constitutional: She is oriented to person, place, and time. She appears well-developed and well-nourished.   HENT:   Head: Normocephalic and atraumatic.   Right Ear: External ear normal.   Left Ear: External ear normal.   Mouth/Throat: Oropharynx is clear and moist.   Eyes: Conjunctivae and EOM are normal.   Neck: Normal range of motion. Neck supple.   Cardiovascular: Normal rate, regular rhythm, normal heart sounds and intact distal pulses.   Pulmonary/Chest: Effort normal and breath sounds normal.   Abdominal: Soft. Bowel sounds are normal.   Musculoskeletal: Normal range of motion.   Neurological: She is alert and oriented to person, place, and time.   Skin: Skin is warm and dry.   Nursing note and vitals reviewed.        Procedures     N/A    Diagnostic Study Results     EKG: N/A    Monitor: N/A    Laboratory results reviewed by ED provider:    Results     Procedure Component Value Units Date/Time    HCG, Qualitative, Urine [[id number]] Collected:  11/08/18 2009    Specimen:  Urine, Random Updated:  11/08/18 2026     human chorionic gonadotropin (hCG), UR, Qual. Negative    Urinalysis w Microscopic and Culture if Indicated [[id number]]  (Abnormal) Collected:  11/08/18 2009    Specimen:  Urine,  Random Updated:  11/08/18 2025     Color, UA Yellow     Clarity, UA Clear     Specific Gravity, UR 1.025     pH, Urine 5.5 pH      Protein, UR Negative mg/dL      Glucose, UA Negative mg/dL      Ketones UA Negative mg/dL      Bilirubin, UA Negative mg/dL      Blood, UA Trace mg/dL      Nitrite, UA Negative     Urobilinogen, UA 0.2 mg/dL      Leukocyte Esterase, UA Negative Leu/uL      UR Micro Performed     WBC, UA None  Seen /hpf      RBC, UA 3-4 /hpf      Bacteria, UA Rare /hpf      Squam Epithel, UA 1-5 /lpf           Radiologic study results reviewed by ED provider:    Radiology Results (24 Hour)     Procedure Component Value Units Date/Time    XR Lumbar Spine AP And Lateral [[id number]] Collected:  11/08/18 2039    Order Status:  Completed Updated:  11/08/18 2041    Narrative:       Clinical History:  Lumbar back pain    Ordering Comments:   None.      Study Notes:   LT. Lower back pain. No known injury.     Examination:  AP and lateral views of the lumbar spine.    Comparison:  None available.    Findings:  Lumbar levoscoliosis with maintenance of normal lumbar vertebral body heights and disc spaces. Sacroiliac joints appear intact.      Impression:       Lumbar levoscoliosis with no fracture or AP malalignment. Sacroiliac joints are intact.    ReadingStation:[id number]      .    Rendering Provider: Minette Brine II, MD      VS     Patient Vitals for the past 24 hrs:   BP Temp Temp src Pulse Resp SpO2 Height Weight   11/08/18 2112 124/76 - - - - 98 % - -   11/08/18 2001 122/68 98 F (36.7 C) Oral 78 18 98 % 1.6 m 123.3 kg         Clinical Course in Emergency Department     Consults: N/A    Reevaluation: N/A    MDM: UTI, Back Pain,     Diagnosis and Disposition     Clinical Impression  1. Acute left-sided low back pain without sciatica        Disposition  ED Disposition     ED Disposition Condition Date/Time Comment    Discharge  Fri Nov 08, 2018  9:07 PM Senaida Lange discharge to home/self care.    Condition at disposition: Stable          Vital signs were reviewed at the time of disposition.  Patient Vitals for the past 24 hrs:   BP Temp Temp src Pulse Resp SpO2 Height Weight   11/08/18 2112 124/76 - - - - 98 % - -   11/08/18 2001 122/68 98 F (36.7 C) Oral 78 18 98 % 1.6 m 123.3 kg          Prescriptions  New Prescriptions    No medications on file                 SIGNED BY:  Minette Brine II, MD        This chart was  generated by an EMR and may contain errors, including typographical, or omissions not intended by the user.             Alger Simons, MD  11/08/18 2112

## 2018-11-08 NOTE — ED Notes (Signed)
Dr. Berry in to recheck pt.

## 2018-11-08 NOTE — Discharge Instructions (Signed)
Possible Causes of Low Back or Leg Pain    BIG: The symptoms in your back or leg may be due to pressure on a nerve. This pressure may be caused by a damaged disk or by abnormal bone growth. Either way, you may feel pain, burning, tingling, or numbness. If you have pressure on a nerve that connects to the sciatic nerve, pain may shoot down your leg.    Pressure from the disk  Constant wear and tear can weaken a disk over time and cause back pain. The disk can then be damaged by a sudden movement or injury. If its soft center starts to bulge, the disk may press on a nerve. Or the outside of the disk may tear, and the soft center may squeeze through and pinch a nerve.    Pressure from bone  As a disk wears out, the vertebrae right above and below the disk start to touch. This can put pressure on a nerve. Often, abnormal bone (called bone spurs) grows where the vertebrae rub against each other. This can cause the foramen or the spinal canal to narrow (called stenosis) and press against a nerve.  StayWell last reviewed this educational content on 02/15/2017   2000-2019 [employer] [place]. [address], [place], [place] [address]. All rights reserved. This information is not intended as a substitute for professional medical care. Always follow your healthcare professional's instructions.        [facility]       Practice Name        Phone     Location       [facility]       [phone] [address] [place]  [address]         [facility]         [phone] [address]    [place], [place]  [address]           [facility]         [phone] [address],    [place], [place] [address]           [facility]       [phone] or Toll      Free: [phone]     [phone] [address]    [place], [place]  [address]       [facility]          [phone] [address],    [place], [place] [address]         [facility]       [phone] S[address],    [place], [place] [address]         [facility]       [phone] [address]    [place], [place]  [address]       [facility]       [phone] [address],    [place], [place] [address]  [facility]       [phone]     [address],    [place], [place] [address]         [facility]       [phone] [address],    [place], [place] [address]         [facility]       [phone]     [address]    [place], [place]  [address]           [facility]       [phone]     [address]    [place], [place]  [address]          [facility]       [facility]       [phone]     [address]          [address]    [place], [place]  [address]       [facility]       [phone]     [address]    [place], [place]  [address]         [facility]       [phone]     [address] [place]    [address] [place], [place]  [address]

## 2018-11-08 NOTE — ED Notes (Signed)
Pt provided w/ written and verbal discharge instructions regarding f/u, treatment, and s/s to monitor for. Pt confirmed understanding of all provided instructions and left treatment area in NAD. Steady gait noted.

## 2019-04-12 IMAGING — CR DG CHEST 2V
2 series · 2 of 2 positions shown · non-contrast
Comparison: None.

CLINICAL DATA: Central chest pain, shortness of breath and
productive cough.

EXAM:
CHEST  2 VIEW

[chest pa]
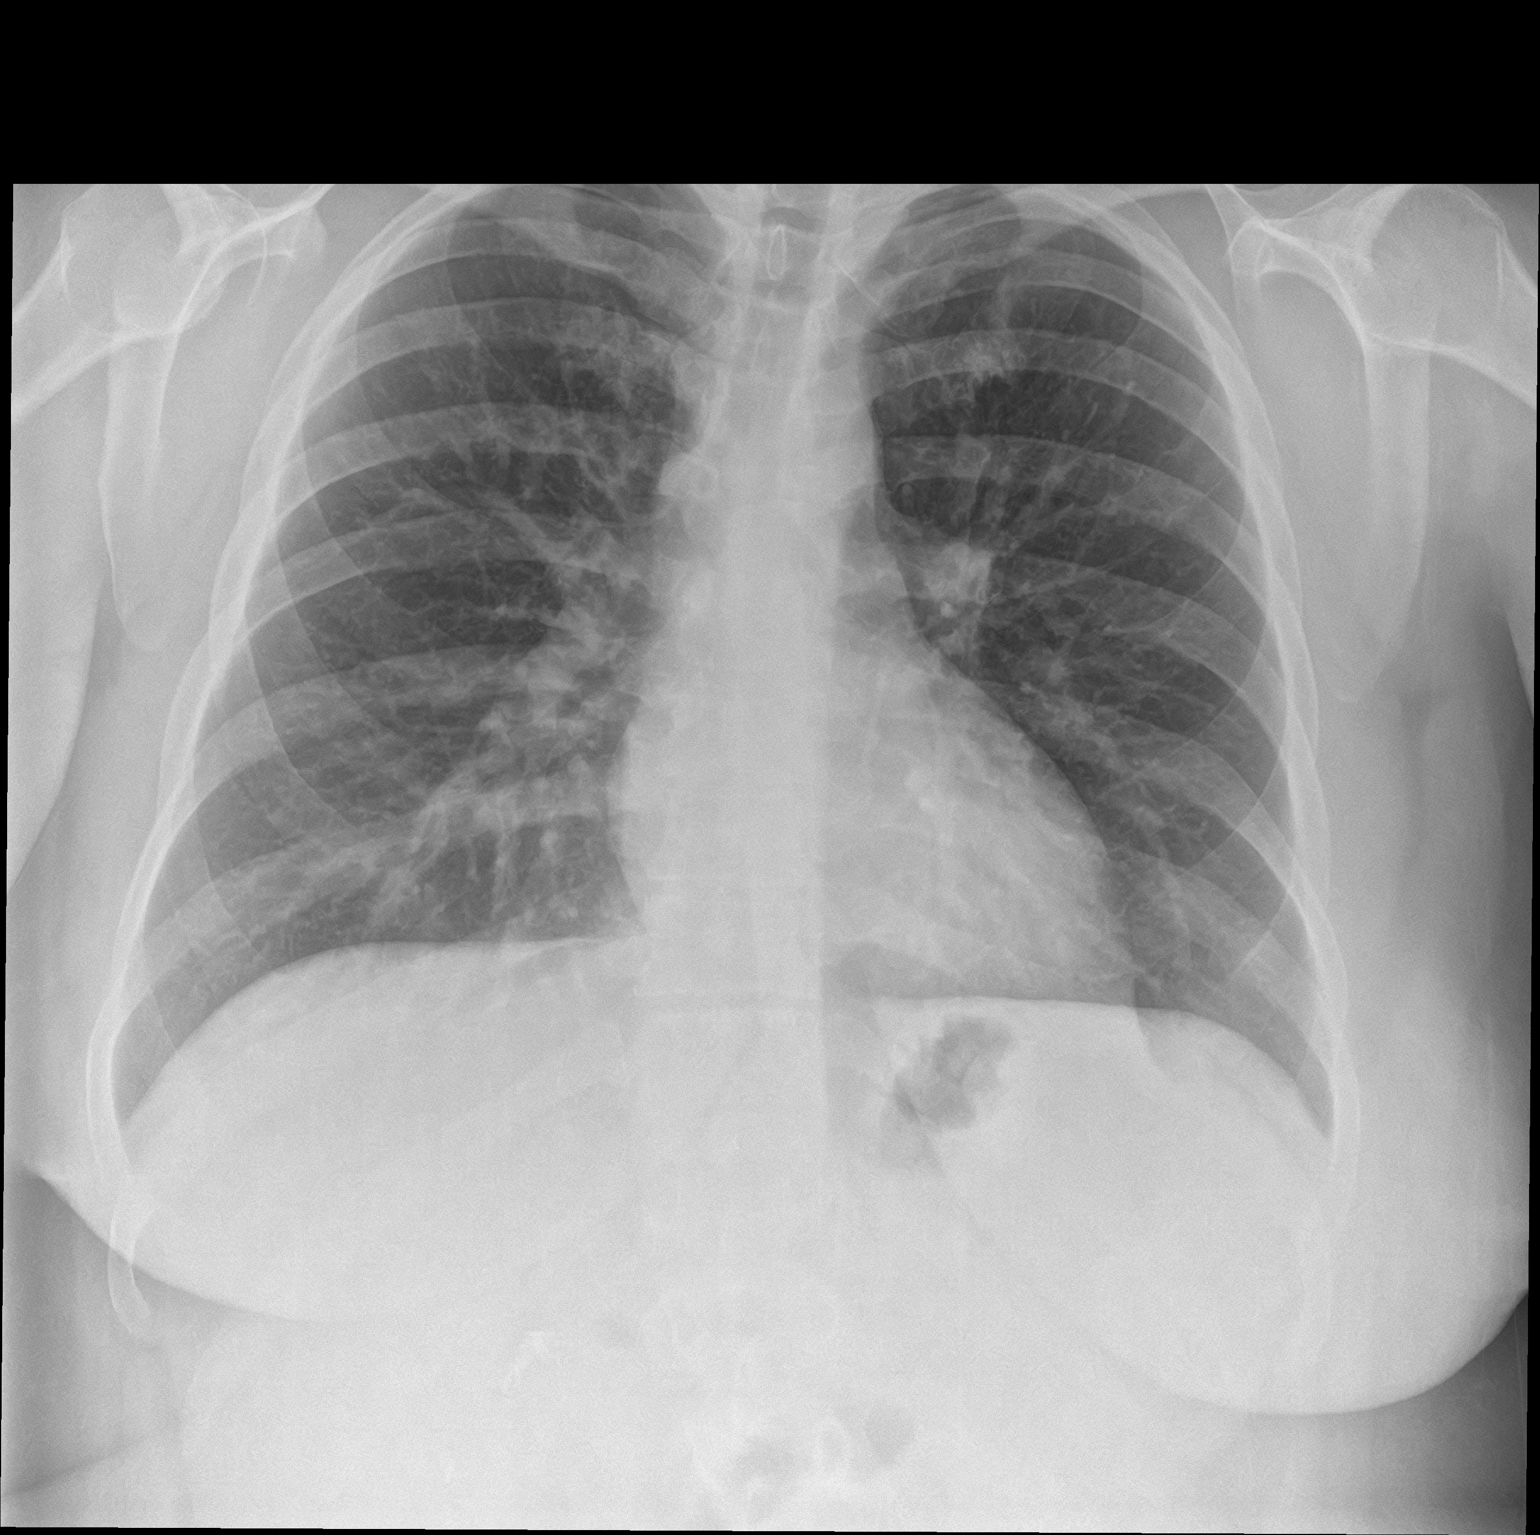

[chest lat]
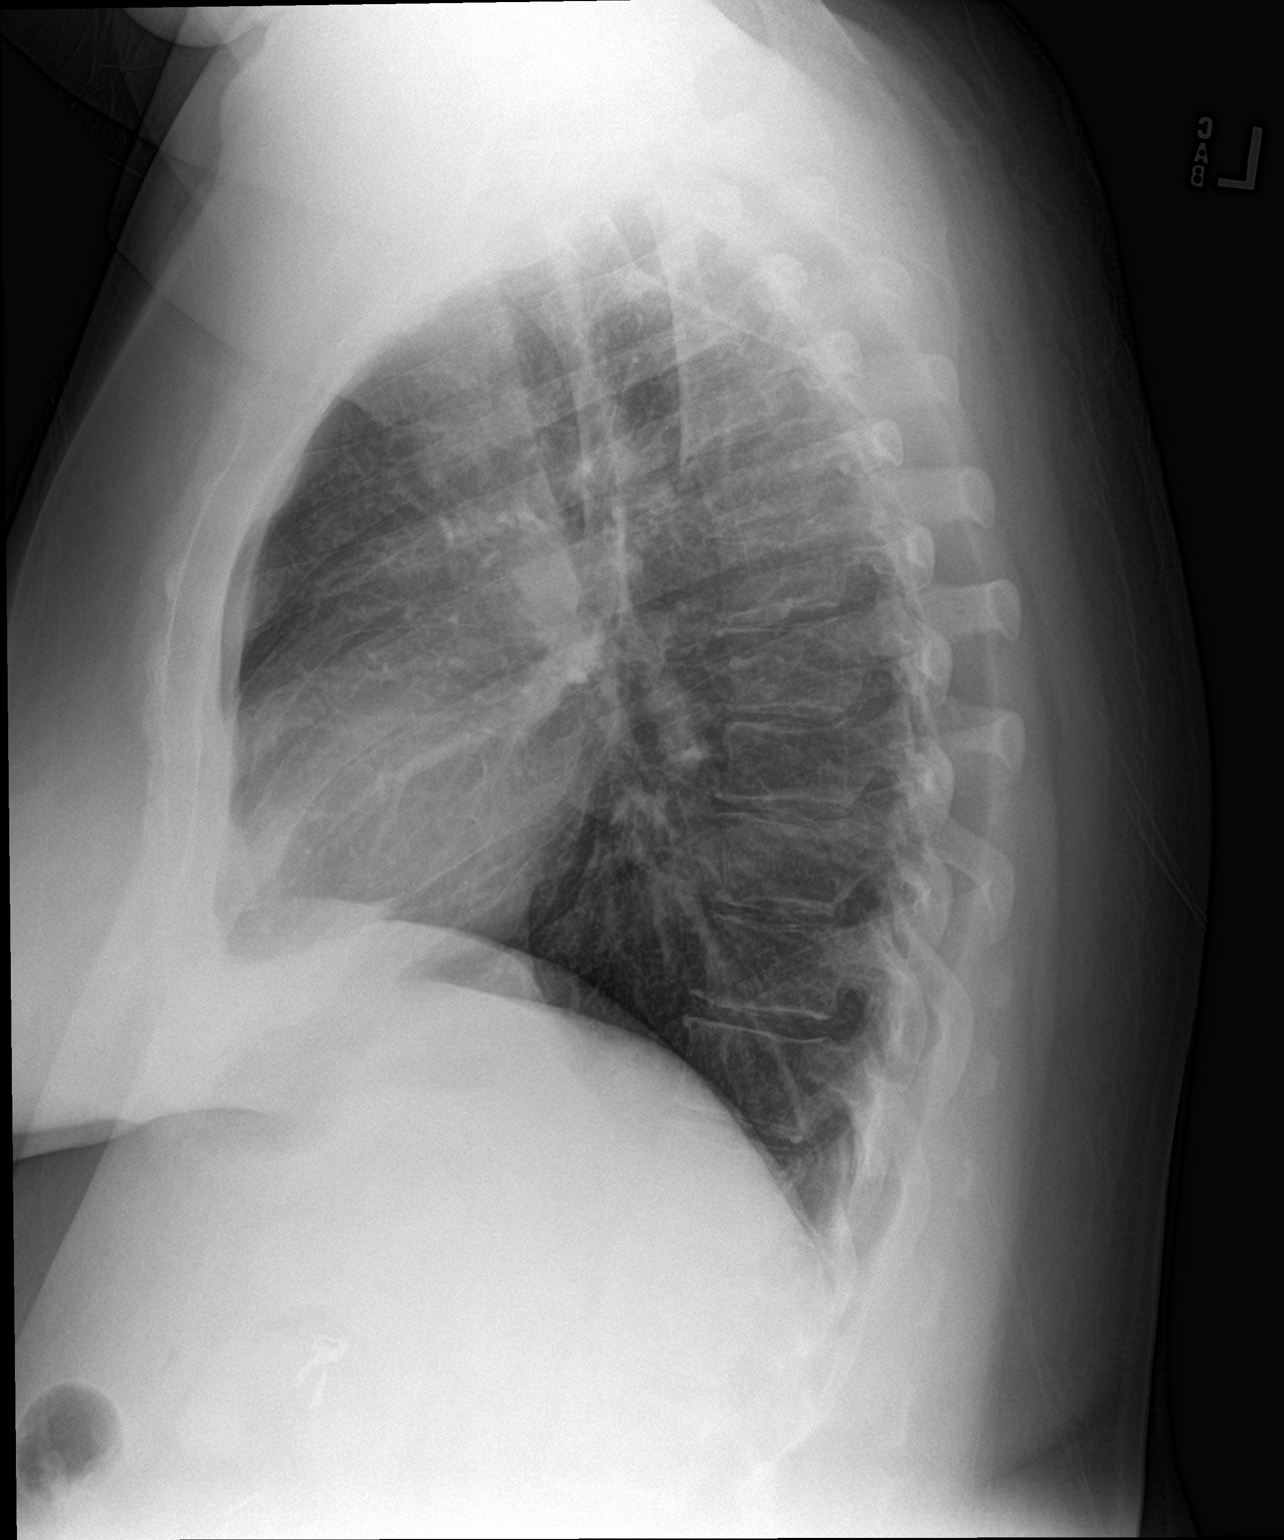

[2 of 2 positions shown; findings below may reference images not displayed]

FINDINGS: The heart size and mediastinal contours are within normal limits.
There is some bronchial thickening bilaterally predominantly in a
perihilar distribution, right greater than left. There may be some
mucous plugging on the right. There is no evidence of pulmonary
edema, focal airspace consolidation, pneumothorax, nodule or pleural
fluid. The visualized skeletal structures are unremarkable.
IMPRESSION: Bronchial thickening bilaterally in a perihilar distribution,
slightly more prominently on the right side. There may be some
associated mucous plugging.

## 2019-05-27 DIAGNOSIS — F331 Major depressive disorder, recurrent, moderate: Secondary | ICD-10-CM | POA: Insufficient documentation

## 2019-08-31 ENCOUNTER — Emergency Department: Payer: Self-pay

## 2019-08-31 ENCOUNTER — Encounter: Payer: Self-pay | Admitting: Sports Medicine"

## 2019-08-31 ENCOUNTER — Emergency Department
Admission: EM | Admit: 2019-08-31 | Discharge: 2019-08-31 | Disposition: A | Discharge status: Home / Self Care | Payer: Self-pay | Attending: Sports Medicine" | Admitting: Sports Medicine"

## 2019-08-31 DIAGNOSIS — S92514A Nondisplaced fracture of proximal phalanx of right lesser toe(s), initial encounter for closed fracture: Secondary | ICD-10-CM | POA: Insufficient documentation

## 2019-08-31 DIAGNOSIS — W2209XA Striking against other stationary object, initial encounter: Secondary | ICD-10-CM | POA: Insufficient documentation

## 2019-08-31 MED ORDER — KETOROLAC TROMETHAMINE 60 MG/2ML IM SOLN
INTRAMUSCULAR | Status: AC
Start: 2019-08-31 — End: ?
  Filled 2019-08-31: qty 2

## 2019-08-31 MED ORDER — KETOROLAC TROMETHAMINE 60 MG/2ML IM SOLN
60.00 mg | Freq: Once | INTRAMUSCULAR | Status: AC
Start: 2019-08-31 — End: 2019-08-31
  Administered 2019-08-31: 19:00:00 60 mg via INTRAMUSCULAR

## 2019-08-31 MED ORDER — KETOROLAC TROMETHAMINE 10 MG PO TABS
10.00 mg | ORAL_TABLET | Freq: Three times a day (TID) | ORAL | 0 refills | Status: AC | PRN
Start: 2019-08-31 — End: ?

## 2019-08-31 NOTE — ED Provider Notes (Signed)
[facility] EMERGENCY DEPARTMENT History and Physical Exam      Patient Name: Natalie Mcpherson, Natalie Mcpherson  Encounter Date:  08/31/2019  Attending Physician: Gareth Morgan, MD  PCP: Marisa Sprinkles, MD  Patient DOB:  10-01-1981  MRN:  [id number]  Room:  E7/ED7-A      History of Presenting Illness     Chief complaint: Foot Injury    HPI/ROS is limited by: none  HPI/ROS given by: patient    CONTEXT: Natalie Mcpherson is a 38 y.o. female who presents with Complaints of pain to her right foot area. DURATION:   She states that she stubbed her foot on a couch leg 2 days ago.   LOCATION:  She is complaining of bruising and pain to the distal metatarsals and proximal phalanges of the second third and fourth toes.  SEVERITY: The symptoms are described as moderate    QUALITY:  The pain is dull throbbing and constant.   ASSOCIATED SIGNS/ SYMPTOMS:  She denies any pain to the ankle or proximal 5th metatarsal or elsewhere.  EXACERBATING/ MITIGATING FACTORS:  The pain is worse with walking or anything that touches the affected area of her right foot.     Review of Systems     Review of Systems   Constitutional: Negative for chills and fever.   Gastrointestinal: Negative for nausea and vomiting.     All other systems reviewed and all are negative.     Allergies     Pt is allergic to codeine; cymbalta [duloxetine hcl]; erythromycin; and shellfish-derived products.    Medications     No current facility-administered medications for this encounter.     Current Outpatient Medications:     albuterol (PROVENTIL) (2.5 MG/3ML) 0.083% nebulizer solution, Take 2.5 mg by nebulization every 6 (six) hours as needed for Wheezing, Disp: , Rfl:     diphenhydrAMINE (BENADRYL) 25 MG tablet, Take 25 mg by mouth every 6 (six) hours as needed, Disp: , Rfl:     ketorolac (TORADOL) 10 MG tablet, Take 1 tablet (10 mg total) by mouth 3 (three) times daily as needed for Pain, Disp: 15 tablet, Rfl: 0    predniSONE (DELTASONE) 10 MG tablet, Take 5 pills on day one, take 4 pills on  day 2, take 3 pills on day 3, take 2 pills on day 4, take one pill on day 5., Disp: 15 tablet, Rfl: 0     Past Medical History     Pt has a past medical history of Fibromyalgia, Lyme disease, and Meningitis spinal.    Past Surgical History     Pt has a past surgical history that includes Cholecystectomy; TONSILLECTOMY; and Cesarean section.    Family History     The family history is not on file.    Social History     Pt reports that she has never smoked. She has never used smokeless tobacco. She reports that she does not drink alcohol or use drugs.    Physical Exam     Blood pressure 116/74, pulse 71, temperature 98.9 F (37.2 C), temperature source Oral, resp. rate 16, SpO2 97 %.    GENERAL: The patient is well-developed, obese,  female whom appears in moderate discomfort and in no distress.   Patient is alert, and oriented to person place and circumstance. There is no evidence of respiratory distress. The patient ambulates with some discomfort.    SKIN:  Warm, dry, mucous membranes moist, normal turgor, no rash noted.  EXTREMITIES:    Right lower  extremity: No shortening deformity or abnormal rotation. She has a localized area of bruising swelling and tenderness to the dorsum of her right Right foot. This is in the region of the Distal metatarsals and proximal phalanges of the second through fourth toes. No tenderness or instability to the ankle or remainder of the foot. In particular no tenderness over the proximal 5th metatarsal.  Otherwise the extremity and foot reveal: No visible deformity , free range of motion.  No edema or cyanosis.       Orders Placed     Orders Placed This Encounter   Procedures    XR Foot Right AP Lateral And Oblique    Buddy tape (specify site)       ED Medication Orders (From admission, onward)    Start Ordered     Status Ordering Provider    08/31/19 1827 08/31/19 1826  ketorolac (TORADOL) IM injection 60 mg  Once in ED     Route: Intramuscular  Ordered Dose: 60 mg     Last MAR  action:  Given Mylz Yuan CLARK          Diagnostic Results       The results of the diagnostic studies below have been reviewed by myself:    Radiologic Studies  Radiology Results (24 Hour)     Procedure Component Value Units Date/Time    XR Foot Right AP Lateral And Oblique [[id number]] Collected:  08/31/19 1914    Order Status:  Completed Updated:  08/31/19 1917    Narrative:       Clinical History:  Stubbed foot on couch. Pain/bruising Second third and fourth toes/metatarsals    Ordering Comments:   None.      Study Notes:   Pt kicked couch 3 days ago, has 3rd-5th right toe and foot bruising.     Examination:  AP, lateral and oblique views of the right foot.    Comparison:  None available.    Findings:  There is an acute nondisplaced extra-articular fracture of the shaft of the proximal phalanx of the right fourth toe.    There is no other fracture or dislocation of the right foot.    There is mild hallux valgus.    There is a small degenerative dorsal heel spur at the attachment of the Achilles tendon. The Achilles tendon has a normal x-ray appearance.      Impression:       1.  Acute nondisplaced extra-articular fracture of the proximal phalanx of the right fourth toe.  2.  Mild hallux valgus of the right foot and small degenerative right heel spur.    ReadingStation:[id number]            MDM / Critical Care     Differential diagnosis considered includes:  Contusion versus fracture right foot metatarsal/proximal phalanges.       Procedures     The patient was offered pain medication but states that the only thing she can take is Toradol.     Diagnosis / Disposition     Clinical Impression  1. Closed nondisplaced fracture of proximal phalanx of lesser toe of right foot, initial encounter        Disposition  ED Disposition     ED Disposition Condition Date/Time Comment    Discharge  Sun Aug 31, 2019  7:31 PM Senaida Lange discharge to home/self care.    Condition at disposition: Stable          Gardiner Barefoot,  MD  [address] [employer]  [place] [address]  [phone]    In 1 week  As neededReturn to the Emergency Department if symptoms worsen or if you have any questions whatsoever. Feel free to call at anytime with questions about your diagnosis and/or discharge instructions etc.       Prescriptions  New Prescriptions    KETOROLAC (TORADOL) 10 MG TABLET    Take 1 tablet (10 mg total) by mouth 3 (three) times daily as needed for Pain       Note:  This chart was generated by the Epic EMR system/ speech recognition and may contain inherent errors, including typographical, or omissions not intended by the user       Gareth Morgan, MD  08/31/19 1932

## 2019-08-31 NOTE — ED Notes (Signed)
XR at bedside

## 2019-08-31 NOTE — ED Notes (Addendum)
Pt presents from home reports that she hit her R foot and toes on the couch leg two days ago, reports pain has not gone away, has not taken anything for pain today. Bruising noted to top of foot, down and onto 4th and 5th toes. Pt reports pain in these locations as well as to the bottom of her foot

## 2019-08-31 NOTE — Discharge Instructions (Signed)
Closed Toe Fracture  Your toe is broken (fractured). This causes pain, swelling, and sometimes bruising. This injury usually takes about 4 to 6 weeks to heal, but can sometimes take longer. Toe injuries are often treated by taping the injured toe to the next one (buddy taping). Or you may have a hard shoe, splint, or cast. These protect the injured toe and hold it in position.   If the toenail has been severely injured, it may fall off in 1 to 2 weeks. It takes up to 12 months for a new toenail to grow back.   Home care  Follow these guidelines when caring for yourself at home:  · You may be given a cast shoe to wear to keep your toe from moving. If not, you can use a sandal or any shoe that doesn’t put pressure on the injured toe until the swelling and pain go away. If using a sandal, be careful not to strike your foot against anything. Another injury could make the fracture worse. If you were given crutches, don’t put full weight on the injured foot until you can do so without pain, or as directed by your healthcare provider.  · Keep your foot elevated to reduce pain and swelling. When sleeping, put a pillow under the injured leg. When sitting, support the injured leg so it is above your waist. This is very important during the first 2 days (48 hours).  · Put an ice pack on the injured area. Do this for 20 minutes every 1 to 2 hours the first day for pain relief. You can make an ice pack by wrapping a plastic bag of ice cubes in a thin towel. As the ice melts, be careful that any cloth or paper tape doesn’t get wet. Continue using the ice pack 3 to 4 times a day for the next 2 days. Then use the ice pack as needed to ease pain and swelling.  · If buddy tape was used and it becomes wet or dirty, change it. You may replace it with paper, plastic, or cloth tape. Cloth tape and paper tapes must be kept dry.  · You may use acetaminophen or ibuprofen to control pain, unless another pain medicine was prescribed. If you  have chronic liver or kidney disease, talk with your healthcare provider before using these medicines. Also talk with your provider if you’ve had a stomach ulcer or gastrointestinal bleeding.  · You may return to sports or physical education activities after 4 weeks when you can run without pain, or as directed by your healthcare provider.  Follow-up care  Follow up with your healthcare provider or as advised. This is to make sure the bone is healing the way it should.   X-rays may be taken. You will be told of any new findings that may affect your care.  When to seek medical advice  Call your healthcare provider right away if any of these occur:  · Pain or swelling gets worse  · The cast/splint cracks  · The cast and padding get wet and stays wet more than 24 hours  · Bad odor from the cast/splint or wound fluid stains the cast  · Tightness or pressure under the cast/splint gets worse  · Toe becomes cold, blue, numb, or tingly  · You can’t move the toe  · Signs of infection: fever, redness, warmth, swelling, or drainage from the wound or cast  · Fever of 100.4ºF (38ºC) or higher, or as directed by your healthcare provider  ·   Shaking chills  StayWell last reviewed this educational content on 01/18/2019  © 2000-2020 [employer] [address], [place] [address]. All rights reserved. This information is not intended as a substitute for professional medical care. Always follow your healthcare professional's instructions.

## 2019-08-31 NOTE — ED Notes (Signed)
Transported to ED room #7 via wheelchair

## 2019-08-31 NOTE — ED Notes (Addendum)
Pt requesting to have a boot, educated that this is not clinically necessary for the fracture that she has. Pt concerned about not being able to wear a shoe, pt educated that she can use a loose shoe or an open toed shoe. Pt reports she doesn't have this type of shoe, pt informed that [employer] has some cheat shoes she can use if necessary. PT states " well it's not a cheap as here because here I have Medicaid". Pt again educated that the boot is not clinically appropriate as it would put pressure on the toes as would a post-op shoe. Pt declined to have her work note, wanted to know how she could take her home medication tonight since [place] is closed. Pt educated that tylenol would what we recommend her to take tonight since we already gave Toradol and NSAIDs like motrin or Alleve are in the same family which would put her kidnies at risk for injury. Pt overall displeased with education and appeared irritated. Pt declined a wheel chair to get out of the department today. Pt ambulated independently and without difficulty wearing a flip flop on her affected foot, appears in no acute distress

## 2019-09-02 ENCOUNTER — Telehealth: Payer: Self-pay

## 2019-09-02 NOTE — Telephone Encounter (Signed)
Called patient after receiving an alert from the discharge follow-up phone call system that she was worse - patient has a non-displaced fracture of the right 4th toe and states it is very painful to ambulate.  I spoke with Dr. Allyson Sabal and a Cam boot was ordered for patient.  Patient will come to the hospital later today to have the boot applied and education provided.

## 2022-09-25 ENCOUNTER — Encounter: Payer: Self-pay | Admitting: Internal Medicine

## 2022-09-25 ENCOUNTER — Ambulatory Visit: Payer: Medicaid Other | Admitting: Internal Medicine

## 2022-09-25 VITALS — BP 135/82 | HR 70 | Ht 63.0 in | Wt 298.2 lb

## 2022-09-25 DIAGNOSIS — Z131 Encounter for screening for diabetes mellitus: Secondary | ICD-10-CM

## 2022-09-25 DIAGNOSIS — Z23 Encounter for immunization: Secondary | ICD-10-CM | POA: Diagnosis not present

## 2022-09-25 DIAGNOSIS — J4520 Mild intermittent asthma, uncomplicated: Secondary | ICD-10-CM

## 2022-09-25 DIAGNOSIS — Z13220 Encounter for screening for lipoid disorders: Secondary | ICD-10-CM | POA: Diagnosis not present

## 2022-09-25 DIAGNOSIS — M7542 Impingement syndrome of left shoulder: Secondary | ICD-10-CM

## 2022-09-25 DIAGNOSIS — Z1159 Encounter for screening for other viral diseases: Secondary | ICD-10-CM

## 2022-09-25 DIAGNOSIS — Z7689 Persons encountering health services in other specified circumstances: Secondary | ICD-10-CM

## 2022-09-25 DIAGNOSIS — Z1321 Encounter for screening for nutritional disorder: Secondary | ICD-10-CM | POA: Diagnosis not present

## 2022-09-25 DIAGNOSIS — G43709 Chronic migraine without aura, not intractable, without status migrainosus: Secondary | ICD-10-CM

## 2022-09-25 DIAGNOSIS — R7303 Prediabetes: Secondary | ICD-10-CM

## 2022-09-25 DIAGNOSIS — F418 Other specified anxiety disorders: Secondary | ICD-10-CM

## 2022-09-25 DIAGNOSIS — M797 Fibromyalgia: Secondary | ICD-10-CM

## 2022-09-25 DIAGNOSIS — Z124 Encounter for screening for malignant neoplasm of cervix: Secondary | ICD-10-CM

## 2022-09-25 DIAGNOSIS — Z1329 Encounter for screening for other suspected endocrine disorder: Secondary | ICD-10-CM

## 2022-09-25 DIAGNOSIS — E782 Mixed hyperlipidemia: Secondary | ICD-10-CM

## 2022-09-25 DIAGNOSIS — Z114 Encounter for screening for human immunodeficiency virus [HIV]: Secondary | ICD-10-CM

## 2022-09-25 MED ORDER — GABAPENTIN 300 MG PO CAPS: 300.0000 mg | ORAL_CAPSULE | Freq: Three times a day (TID) | ORAL | 3 refills | Status: AC

## 2022-09-25 MED ORDER — TOPIRAMATE 25 MG PO TABS: 25.0000 mg | ORAL_TABLET | Freq: Every day | ORAL | 2 refills | Status: AC

## 2022-09-25 NOTE — Patient Instructions (Signed)
It was a pleasure to see you today.  Thank you for giving [place] the opportunity to be involved in your care.  Below is a brief recap of your visit and next steps.  We will plan to see you again in 4 weeks.  Summary We will check basic labs today You will receive your flu shot I have referred you to orthopedic surgery for your shoulder We will start topamax for migraine prevention I have refilled gabapentin for fibromyalgia I have referred you to OBGYN to establish care   Next steps Follow up in 4 weeks

## 2022-09-25 NOTE — Progress Notes (Unsigned)
New Patient Office Visit  Subjective    Patient ID: Karen Santos, female    DOB: 04-12-1981  Age: 41 y.o. MRN: [id number]  CC:  Chief Complaint  Patient presents with   Establish Care   HPI Karen Santos presents to establish care.  She is a 41 year old woman with a past medical history significant for fibromyalgia, anxiety/depression, panic attacks, migraines, asthma, obesity, HLD, GERD, and prediabetes.  She was previously followed by a primary care provider and [place] associated with [facility].  Karen Santos has several concerns today including requesting a refill of gabapentin for fibromyalgia, wanting to start a medication for migraine headaches, and requesting a referral to orthopedic surgery for left shoulder pain.  She is currently using an albuterol inhaler as needed for asthma but states she is only needed to use the inhaler about once per month.  She is otherwise not taking any medications currently.  She endorses vaping but denies alcohol or additional drug use.  Acute concerns, chronic medical conditions, and outstanding preventative healthcare maintenance items discussed today are individually addressed in A/P below   Outpatient Encounter Medications as of 09/25/2022  Medication Sig   albuterol (PROVENTIL HFA;VENTOLIN HFA) 108 (90 Base) MCG/ACT inhaler Inhale 2 puffs into the lungs every 6 (six) hours as needed for wheezing or shortness of breath.   gabapentin (NEURONTIN) 300 MG capsule Take 1 capsule (300 mg total) by mouth 3 (three) times daily.   topiramate (TOPAMAX) 25 MG tablet Take 1 tablet (25 mg total) by mouth daily.   No facility-administered encounter medications on file as of 09/25/2022.    Past Medical History:  Diagnosis Date   Anxiety    Asthma    Fibromyalgia     Past Surgical History:  Procedure Laterality Date   CESAREAN SECTION     x2   CHOLECYSTECTOMY      History reviewed. No pertinent family history.  Social History   Socioeconomic  History   Marital status: Single    Spouse name: Not on file   Number of children: Not on file   Years of education: Not on file   Highest education level: Not on file  Occupational History   Not on file  Tobacco Use   Smoking status: Every Day    Types: E-cigarettes   Smokeless tobacco: Never  Substance and Sexual Activity   Alcohol use: No   Drug use: No   Sexual activity: Not on file  Other Topics Concern   Not on file  Social History Narrative   Not on file   Social Determinants of Health   Financial Resource Strain: Not on file  Food Insecurity: Not on file  Transportation Needs: Not on file  Physical Activity: Not on file  Stress: Not on file  Social Connections: Not on file  Intimate Partner Violence: Not on file   Review of Systems  Constitutional:  Negative for chills and fever.  HENT:  Negative for sore throat.   Respiratory:  Negative for cough and shortness of breath.   Cardiovascular:  Negative for chest pain, palpitations and leg swelling.  Gastrointestinal:  Negative for abdominal pain, blood in stool, constipation, diarrhea, nausea and vomiting.  Genitourinary:  Negative for dysuria and hematuria.  Musculoskeletal:  Positive for joint pain (Left lateral shoulder pain). Negative for myalgias.  Skin:  Negative for itching and rash.  Neurological:  Positive for headaches. Negative for dizziness.  Psychiatric/Behavioral:  Positive for depression. Negative for suicidal ideas. The  patient is nervous/anxious.     Objective    BP 135/82   Pulse 70   Ht _0  (1.6 m)   Wt 298 lb 3.2 oz (135.3 kg)   SpO2 96%   BMI 52.82 kg/m   Physical Exam Vitals reviewed.  Constitutional:      General: She is not in acute distress.    Appearance: Normal appearance. She is obese. She is not toxic-appearing.  HENT:     Head: Normocephalic and atraumatic.     Right Ear: External ear normal.     Left Ear: External ear normal.     Nose: Nose normal. No congestion or  rhinorrhea.     Mouth/Throat:     Mouth: Mucous membranes are moist.     Pharynx: Oropharynx is clear. No oropharyngeal exudate or posterior oropharyngeal erythema.  Eyes:     General: No scleral icterus.    Extraocular Movements: Extraocular movements intact.     Conjunctiva/sclera: Conjunctivae normal.     Pupils: Pupils are equal, round, and reactive to light.  Cardiovascular:     Rate and Rhythm: Normal rate and regular rhythm.     Pulses: Normal pulses.     Heart sounds: Normal heart sounds. No murmur heard.    No friction rub. No gallop.  Pulmonary:     Effort: Pulmonary effort is normal.     Breath sounds: Normal breath sounds. No wheezing, rhonchi or rales.  Abdominal:     General: Abdomen is flat. Bowel sounds are normal. There is no distension.     Palpations: Abdomen is soft.     Tenderness: There is no abdominal tenderness.  Musculoskeletal:     Cervical back: Normal range of motion.     Right lower leg: No edema.     Left lower leg: No edema.     Comments: Abduction of the left shoulder is limited beyond 90 degrees.  There is tenderness palpation over the deltoid.  Positive empty can  Lymphadenopathy:     Cervical: No cervical adenopathy.  Skin:    General: Skin is warm and dry.     Capillary Refill: Capillary refill takes less than 2 seconds.     Coloration: Skin is not jaundiced.  Neurological:     General: No focal deficit present.     Mental Status: She is alert and oriented to person, place, and time.  Psychiatric:        Mood and Affect: Mood normal.        Behavior: Behavior normal.    Last CBC Lab Results  Component Value Date   WBC 10.7 09/25/2022   HGB 14.4 09/25/2022   HCT 44.2 09/25/2022   MCV 89 09/25/2022   MCH 29.0 09/25/2022   RDW 13.1 09/25/2022   PLT 340 03/50/0938   Last metabolic panel Lab Results  Component Value Date   GLUCOSE 91 09/25/2022   NA 143 09/25/2022   K 4.8 09/25/2022   CL 104 09/25/2022   CO2 21 09/25/2022   BUN  15 09/25/2022   CREATININE 1.20 (H) 09/25/2022   EGFR 59 (L) 09/25/2022   CALCIUM 9.2 09/25/2022   PROT 7.7 09/25/2022   ALBUMIN 4.7 09/25/2022   LABGLOB 3.0 09/25/2022   AGRATIO 1.6 09/25/2022   BILITOT 0.7 09/25/2022   ALKPHOS 102 09/25/2022   AST 19 09/25/2022   ALT 25 09/25/2022   ANIONGAP 9 01/27/2018   Last lipids Lab Results  Component Value Date   CHOL 183 09/25/2022  HDL 37 (L) 09/25/2022   LDLCALC 110 (H) 09/25/2022   TRIG 205 (H) 09/25/2022   CHOLHDL 4.9 (H) 09/25/2022   Last hemoglobin A1c Lab Results  Component Value Date   HGBA1C 5.6 09/25/2022   Last thyroid functions Lab Results  Component Value Date   TSH 1.040 09/25/2022   Last vitamin D Lab Results  Component Value Date   VD25OH 14.5 (L) 09/25/2022   Last vitamin B12 and Folate No results found for: "VITAMINB12", "FOLATE"      Assessment & Plan:   Problem List Items Addressed This Visit       Migraines    Today she endorses migraine headaches that occur several times weekly.  She states that they typically originate in the occipital region of her head and radiate to the front.  She endorses associated photophobia and phonophobia.  She has previously been prescribed Imitrex but is currently out of medication.  She is interested in starting a prophylactic medication today. -I have prescribed Topamax for migraine prevention.  Plan for follow-up in 4 weeks for reassessment.      Asthma without status asthmaticus    Has an albuterol inhaler for as needed use.  Asymptomatic today.  Unremarkable pulmonary exam.  States that she is only needed to use the inhaler once per month.  No changes today.      Rotator cuff impingement syndrome of left shoulder    Previously documented history of impingement syndrome of the left shoulder.  She requests referral to orthopedic surgery today to establish care.  Referral placed.  Abduction is limited on exam and there is tenderness palpation over the deltoid.   Positive empty can.      Depression with anxiety    She has a documented history of anxiety and depression.  She was previously prescribed Klonopin but is not currently taking it.  She has been taking gabapentin for both anxiety and fibromyalgia.  Gabapentin 300 mg 3 times daily refilled today.      Fibromyalgia    Previously documented history of fibromyalgia.  Gabapentin refilled today.      Mixed hyperlipidemia    Counseled on the Mediterranean diet.  Repeat lipid panel ordered today.      Encounter to establish care - Primary    Presenting today to establish care.  Recent medical records and labs reviewed. -Basic labs ordered today, including one-time HIV/HCV screening -Influenza vaccine administered today -Referral placed to OB/GYN to establish care for Pap smear      Prediabetes    Previously documented history of prediabetes.  Repeat A1c ordered today.  Counseled on lifestyle modifications aimed at weight loss.      Morbid obesity ([place])    BMI 52 today.  We discussed that her weight is significantly contributing to her other chronic medical conditions.  We reviewed appropriate lifestyle modification and weight loss including the Mediterranean diet and the national recommendation for at least 150 total minutes of moderate intensity exercise on a weekly basis.      Return in about 4 weeks (around 10/23/2022).   Johnette Abraham, MD

## 2022-09-26 LAB — CBC WITH DIFFERENTIAL/PLATELET
Basophils Absolute: 0.1 10*3/uL (ref 0.0–0.2)
Basos: 1 %
EOS (ABSOLUTE): 0.3 10*3/uL (ref 0.0–0.4)
Hemoglobin: 14.4 g/dL (ref 11.1–15.9)
Immature Grans (Abs): 0 10*3/uL (ref 0.0–0.1)
Immature Granulocytes: 0 %
Lymphocytes Absolute: 2.6 10*3/uL (ref 0.7–3.1)
MCHC: 32.6 g/dL (ref 31.5–35.7)
MCV: 89 fL (ref 79–97)
Monocytes Absolute: 0.5 10*3/uL (ref 0.1–0.9)
Neutrophils: 66 %
Platelets: 340 10*3/uL (ref 150–450)
RBC: 4.97 x10E6/uL (ref 3.77–5.28)
RDW: 13.1 % (ref 11.7–15.4)

## 2022-09-26 LAB — VITAMIN D 25 HYDROXY (VIT D DEFICIENCY, FRACTURES): Vit D, 25-Hydroxy: 14.5 ng/mL — ABNORMAL LOW (ref 30.0–100.0)

## 2022-09-26 LAB — LIPID PANEL
Chol/HDL Ratio: 4.9 ratio — ABNORMAL HIGH (ref 0.0–4.4)
Cholesterol, Total: 183 mg/dL (ref 100–199)
HDL: 37 mg/dL — ABNORMAL LOW (ref >39)
VLDL Cholesterol Cal: 36 mg/dL (ref 5–40)

## 2022-09-26 LAB — CMP14+EGFR
AST: 19 IU/L (ref 0–40)
Albumin/Globulin Ratio: 1.6 (ref 1.2–2.2)
Alkaline Phosphatase: 102 IU/L (ref 44–121)
BUN/Creatinine Ratio: 13 (ref 9–23)
BUN: 15 mg/dL (ref 6–24)
CO2: 21 mmol/L (ref 20–29)
Chloride: 104 mmol/L (ref 96–106)
Creatinine, Ser: 1.2 mg/dL — ABNORMAL HIGH (ref 0.57–1.00)
Globulin, Total: 3 g/dL (ref 1.5–4.5)
Sodium: 143 mmol/L (ref 134–144)
Total Protein: 7.7 g/dL (ref 6.0–8.5)
eGFR: 59 mL/min/{1.73_m2} — ABNORMAL LOW (ref >59)

## 2022-09-26 LAB — TSH+FREE T4: TSH: 1.04 u[IU]/mL (ref 0.450–4.500)

## 2022-09-26 LAB — HEMOGLOBIN A1C: Hgb A1c MFr Bld: 5.6 % (ref 4.8–5.6)

## 2022-09-27 ENCOUNTER — Other Ambulatory Visit: Payer: Self-pay | Admitting: Internal Medicine

## 2022-09-27 DIAGNOSIS — G43909 Migraine, unspecified, not intractable, without status migrainosus: Secondary | ICD-10-CM | POA: Insufficient documentation

## 2022-09-27 DIAGNOSIS — R7303 Prediabetes: Secondary | ICD-10-CM | POA: Insufficient documentation

## 2022-09-27 DIAGNOSIS — Z7689 Persons encountering health services in other specified circumstances: Secondary | ICD-10-CM | POA: Insufficient documentation

## 2022-09-27 DIAGNOSIS — E559 Vitamin D deficiency, unspecified: Secondary | ICD-10-CM

## 2022-09-27 LAB — CMP14+EGFR
ALT: 25 IU/L (ref 0–32)
Albumin: 4.7 g/dL (ref 3.9–4.9)
Bilirubin Total: 0.7 mg/dL (ref 0.0–1.2)
Calcium: 9.2 mg/dL (ref 8.7–10.2)
Glucose: 91 mg/dL (ref 70–99)
Potassium: 4.8 mmol/L (ref 3.5–5.2)

## 2022-09-27 LAB — HCV AB W REFLEX TO QUANT PCR: HCV Ab: NONREACTIVE

## 2022-09-27 LAB — CBC WITH DIFFERENTIAL/PLATELET
Eos: 3 %
Hematocrit: 44.2 % (ref 34.0–46.6)
Lymphs: 25 %
MCH: 29 pg (ref 26.6–33.0)
Monocytes: 5 %
Neutrophils Absolute: 7.1 10*3/uL — ABNORMAL HIGH (ref 1.4–7.0)
WBC: 10.7 10*3/uL (ref 3.4–10.8)

## 2022-09-27 LAB — TSH+FREE T4: Free T4: 1.19 ng/dL (ref 0.82–1.77)

## 2022-09-27 LAB — LIPID PANEL
LDL Chol Calc (NIH): 110 mg/dL — ABNORMAL HIGH (ref 0–99)
Triglycerides: 205 mg/dL — ABNORMAL HIGH (ref 0–149)

## 2022-09-27 LAB — HIV ANTIBODY (ROUTINE TESTING W REFLEX): HIV Screen 4th Generation wRfx: NONREACTIVE

## 2022-09-27 LAB — HEMOGLOBIN A1C: Est. average glucose Bld gHb Est-mCnc: 114 mg/dL

## 2022-09-27 LAB — HCV INTERPRETATION

## 2022-09-27 MED ORDER — VITAMIN D (ERGOCALCIFEROL) 1.25 MG (50000 UNIT) PO CAPS: 50000.0000 [IU] | ORAL_CAPSULE | ORAL | 0 refills | Status: AC

## 2022-09-27 NOTE — Assessment & Plan Note (Signed)
Has an albuterol inhaler for as needed use.  Asymptomatic today.  Unremarkable pulmonary exam.  States that she is only needed to use the inhaler once per month.  No changes today.

## 2022-09-27 NOTE — Assessment & Plan Note (Signed)
Counseled on the [employer] diet.  Repeat lipid panel ordered today.

## 2022-09-27 NOTE — Assessment & Plan Note (Signed)
She has a documented history of anxiety and depression.  She was previously prescribed Klonopin but is not currently taking it.  She has been taking gabapentin for both anxiety and fibromyalgia.  Gabapentin 300 mg 3 times daily refilled today.

## 2022-09-27 NOTE — Assessment & Plan Note (Signed)
Previously documented history of impingement syndrome of the left shoulder.  She requests referral to orthopedic surgery today to establish care.  Referral placed.  Abduction is limited on exam and there is tenderness palpation over the deltoid.  Positive empty can.

## 2022-09-27 NOTE — Assessment & Plan Note (Addendum)
BMI 52 today.  We discussed that her weight is significantly contributing to her other chronic medical conditions.  We reviewed appropriate lifestyle modification and weight loss including the Mediterranean diet and the national recommendation for at least 150 total minutes of moderate intensity exercise on a weekly basis.

## 2022-09-27 NOTE — Assessment & Plan Note (Addendum)
Presenting today to establish care.  Recent medical records and labs reviewed. -Basic labs ordered today, including one-time HIV/HCV screening -Influenza vaccine administered today -Referral placed to OB/GYN to establish care for Pap smear

## 2022-09-27 NOTE — Assessment & Plan Note (Signed)
Previously documented history of fibromyalgia.  Gabapentin refilled today.

## 2022-09-27 NOTE — Assessment & Plan Note (Signed)
Previously documented history of prediabetes.  Repeat A1c ordered today.  Counseled on lifestyle modifications aimed at weight loss.

## 2022-09-27 NOTE — Assessment & Plan Note (Signed)
Today she endorses migraine headaches that occur several times weekly.  She states that they typically originate in the occipital region of her head and radiate to the front.  She endorses associated photophobia and phonophobia.  She has previously been prescribed Imitrex but is currently out of medication.  She is interested in starting a prophylactic medication today. -I have prescribed Topamax for migraine prevention.  Plan for follow-up in 4 weeks for reassessment.

## 2022-10-05 ENCOUNTER — Ambulatory Visit: Payer: Medicaid Other | Admitting: Orthopedic Surgery

## 2022-10-05 ENCOUNTER — Encounter: Payer: Self-pay | Admitting: Orthopedic Surgery

## 2022-10-13 ENCOUNTER — Encounter: Payer: Self-pay | Admitting: Orthopedic Surgery

## 2022-10-13 ENCOUNTER — Ambulatory Visit (INDEPENDENT_AMBULATORY_CARE_PROVIDER_SITE_OTHER): Payer: Medicaid Other

## 2022-10-13 ENCOUNTER — Ambulatory Visit (INDEPENDENT_AMBULATORY_CARE_PROVIDER_SITE_OTHER): Payer: Medicaid Other | Admitting: Orthopedic Surgery

## 2022-10-13 VITALS — BP 127/72 | HR 58 | Ht 63.0 in | Wt 296.0 lb

## 2022-10-13 DIAGNOSIS — G8929 Other chronic pain: Secondary | ICD-10-CM

## 2022-10-13 DIAGNOSIS — M19012 Primary osteoarthritis, left shoulder: Secondary | ICD-10-CM | POA: Diagnosis not present

## 2022-10-13 NOTE — Patient Instructions (Signed)

## 2022-10-13 NOTE — Progress Notes (Signed)
New Patient Visit  Assessment: Karen Santos is a 41 y.o. female with the following: 1. Arthritis of left acromioclavicular joint  Plan: Karen Santos has pain in the superior aspect of the left shoulder.  This localized to the [facility] joint.  There is some degenerative changes on radiographs.  She has had injections in her shoulder in the past, and would like to proceed with another injection today.  This was completed without issues.  Follow-up as needed.  Procedure note injection Left shoulder    Verbal consent was obtained to inject the left shoulder, subacromial space Timeout was completed to confirm the site of injection.  The skin was prepped with alcohol and ethyl chloride was sprayed at the injection site.  A 21-gauge needle was used to inject 40 mg of Depo-Medrol and 1% lidocaine (1 cc) into the [facility] joint and surrounding soft tissue using a superior approach.  There were no complications. A sterile bandage was applied.   Follow-up: Return if symptoms worsen or fail to improve.  Subjective:  Chief Complaint  Patient presents with   Shoulder Pain    Lt shoulder pain for years. Has had shots in the shoulder in the past would like to try another injection if possible     History of Present Illness: Karen Santos is a 41 y.o. female who presents for evaluation of left shoulder pain.  She states that she has intermittent left shoulder pain for years.  She has had multiple injections, which have been effective.  She has pain in the superior aspect of her shoulder.  She notes pain in this area with range of motion.  No physical therapy.  Medications have not been helpful.  Review of Systems: No fevers or chills No numbness or tingling No chest pain No shortness of breath No bowel or bladder dysfunction No GI distress No headaches   Medical History:  Past Medical History:  Diagnosis Date   Anxiety    Asthma    Fibromyalgia     Past Surgical History:  Procedure  Laterality Date   CESAREAN SECTION     x2   CHOLECYSTECTOMY      History reviewed. No pertinent family history. Social History   Tobacco Use   Smoking status: Every Day    Types: E-cigarettes   Smokeless tobacco: Never  Substance Use Topics   Alcohol use: No   Drug use: No    Allergies  Allergen Reactions   Codeine Rash   Duloxetine Nausea And Vomiting and Other (See Comments)   Erythromycin Hives   Shellfish-Derived Products Rash    Current Meds  Medication Sig   albuterol (PROVENTIL HFA;VENTOLIN HFA) 108 (90 Base) MCG/ACT inhaler Inhale 2 puffs into the lungs every 6 (six) hours as needed for wheezing or shortness of breath.   gabapentin (NEURONTIN) 300 MG capsule Take 1 capsule (300 mg total) by mouth 3 (three) times daily.   topiramate (TOPAMAX) 25 MG tablet Take 1 tablet (25 mg total) by mouth daily.   Vitamin D, Ergocalciferol, (DRISDOL) 1.25 MG (50000 UNIT) CAPS capsule Take 1 capsule (50,000 Units total) by mouth every 7 (seven) days for 12 doses.    Objective: BP 127/72   Pulse (!) 58   Ht 5\' 3"  (1.6 m)   Wt 296 lb (134.3 kg)   BMI 52.43 kg/m   Physical Exam:  General: Alert and oriented. and No acute distress. Gait: Normal gait.  Evaluation left shoulder demonstrates no deformity.  Good range  of motion.  Tenderness to palpation over the [facility] joint.  Fingers are warm and well-perfused.  Slightly restricted range of motion due to pain.  Good strength.  IMAGING: I personally ordered and reviewed the following images  X-rays of the left shoulder obtained in clinic today.  No acute injuries are noted.  Well-maintained glenohumeral joint space.  No evidence of proximal humeral migration.  Glenohumeral joint is reduced.  There is narrowing of the [facility] joint, with some associated osteophytes.  Impression: Left shoulder x-ray with AC joint degenerative changes.   New Medications:  No orders of the defined types were placed in this encounter.     Oliver Barre, MD  10/13/2022 10:49 PM

## 2022-10-17 ENCOUNTER — Ambulatory Visit: Payer: Medicaid Other | Admitting: Orthopedic Surgery

## 2022-10-17 ENCOUNTER — Encounter: Payer: Self-pay | Admitting: Radiology

## 2022-10-23 ENCOUNTER — Ambulatory Visit: Payer: Medicaid Other | Admitting: Internal Medicine

## 2022-10-26 ENCOUNTER — Encounter: Payer: Self-pay | Admitting: Internal Medicine

## 2022-12-05 ENCOUNTER — Encounter: Payer: Medicaid Other | Admitting: Obstetrics & Gynecology

## 2022-12-07 ENCOUNTER — Encounter: Payer: Medicaid Other | Admitting: Radiology

## 2022-12-08 ENCOUNTER — Encounter: Payer: Medicaid Other | Admitting: Orthopedic Surgery

## 2022-12-12 ENCOUNTER — Encounter: Payer: Self-pay | Admitting: Internal Medicine

## 2022-12-26 ENCOUNTER — Ambulatory Visit: Payer: Medicaid Other | Admitting: Internal Medicine

## 2022-12-26 ENCOUNTER — Encounter: Payer: Self-pay | Admitting: Internal Medicine

## 2022-12-26 NOTE — Progress Notes (Deleted)
   Acute Office Visit  Subjective:     Patient ID: Karen Santos, female    DOB: Jan 30, 1981, 42 y.o.   MRN: [id number]  No chief complaint on file.   HPI Patient is in today for ***  ROS      Objective:    There were no vitals taken for this visit. {Vitals History (Optional):23777}  Physical Exam  No results found for any visits on 12/26/22.      Assessment & Plan:   Problem List Items Addressed This Visit   None   No orders of the defined types were placed in this encounter.   No follow-ups on file.  Johnette Abraham, MD

## 2022-12-28 ENCOUNTER — Ambulatory Visit: Payer: Medicaid Other | Admitting: Internal Medicine

## 2023-01-01 ENCOUNTER — Encounter: Payer: Medicaid Other | Admitting: Obstetrics and Gynecology

## 2023-01-11 ENCOUNTER — Encounter: Payer: Medicaid Other | Admitting: Obstetrics & Gynecology

## 2023-02-05 ENCOUNTER — Ambulatory Visit: Payer: Medicaid Other | Admitting: Family Medicine

## 2023-02-13 ENCOUNTER — Ambulatory Visit: Payer: Medicaid Other | Admitting: Family Medicine

## 2023-02-14 ENCOUNTER — Encounter: Payer: Medicaid Other | Admitting: Obstetrics and Gynecology

## 2023-02-15 ENCOUNTER — Encounter: Payer: Self-pay | Admitting: Radiology

## 2023-02-20 ENCOUNTER — Ambulatory Visit: Payer: Medicaid Other | Admitting: Family Medicine

## 2023-02-20 ENCOUNTER — Telehealth: Payer: Self-pay | Admitting: Internal Medicine

## 2023-02-20 NOTE — Telephone Encounter (Signed)
Pt has missed 3 appts (10/23/22, 12/26/22 & 02/20/23). How would you like to proceed?

## 2023-02-21 ENCOUNTER — Encounter: Payer: Self-pay | Admitting: Internal Medicine

## 2023-04-03 ENCOUNTER — Telehealth: Payer: Medicaid Other | Admitting: Family Medicine

## 2023-04-03 DIAGNOSIS — J019 Acute sinusitis, unspecified: Secondary | ICD-10-CM

## 2023-04-03 MED ORDER — FLUTICASONE PROPIONATE 50 MCG/ACT NA SUSP: 2.0000 | Freq: Every day | NASAL | 0 refills | Status: AC

## 2023-04-03 MED ORDER — CETIRIZINE HCL 10 MG PO TABS: 10.0000 mg | ORAL_TABLET | Freq: Every day | ORAL | 0 refills | Status: AC

## 2023-04-03 MED ORDER — AMOXICILLIN-POT CLAVULANATE 875-125 MG PO TABS: 1.0000 | ORAL_TABLET | Freq: Two times a day (BID) | ORAL | 0 refills | Status: AC

## 2023-04-03 NOTE — Patient Instructions (Signed)
Karen Santos, thank you for joining Freddy Finner, NP for today's virtual visit.  While this provider is not your primary care provider (PCP), if your PCP is located in our provider database this encounter information will be shared with them immediately following your visit.   A [facility] MyChart account gives you access to today's visit and all your visits, tests, and labs performed at [facility] " click here if you don't have a [facility] MyChart account or go to [email or web address]  Consent: (Patient) Karen Santos provided verbal consent for this virtual visit at the beginning of the encounter.  Current Medications:  Current Outpatient Medications:    amoxicillin-clavulanate (AUGMENTIN) 875-125 MG tablet, Take 1 tablet by mouth 2 (two) times daily for 7 days., Disp: 14 tablet, Rfl: 0   cetirizine (ZYRTEC) 10 MG tablet, Take 1 tablet (10 mg total) by mouth daily., Disp: 30 tablet, Rfl: 0   fluticasone (FLONASE) 50 MCG/ACT nasal spray, Place 2 sprays into both nostrils daily., Disp: 16 g, Rfl: 0   albuterol (PROVENTIL HFA;VENTOLIN HFA) 108 (90 Base) MCG/ACT inhaler, Inhale 2 puffs into the lungs every 6 (six) hours as needed for wheezing or shortness of breath., Disp: 1 Inhaler, Rfl: 2   gabapentin (NEURONTIN) 300 MG capsule, Take 1 capsule (300 mg total) by mouth 3 (three) times daily., Disp: 90 capsule, Rfl: 3   topiramate (TOPAMAX) 25 MG tablet, Take 1 tablet (25 mg total) by mouth daily., Disp: 30 tablet, Rfl: 2   Medications ordered in this encounter:  Meds ordered this encounter  Medications   amoxicillin-clavulanate (AUGMENTIN) 875-125 MG tablet    Sig: Take 1 tablet by mouth 2 (two) times daily for 7 days.    Dispense:  14 tablet    Refill:  0    Order Specific Question:   Supervising Provider    Answer:   Merrilee Jansky [[id number]]   cetirizine (ZYRTEC) 10 MG tablet    Sig: Take 1 tablet (10 mg total) by mouth daily.    Dispense:  30 tablet     Refill:  0    Order Specific Question:   Supervising Provider    Answer:   Merrilee Jansky [[id number]]   fluticasone (FLONASE) 50 MCG/ACT nasal spray    Sig: Place 2 sprays into both nostrils daily.    Dispense:  16 g    Refill:  0    Order Specific Question:   Supervising Provider    Answer:   Merrilee Jansky [id number]     *If you need refills on other medications prior to your next appointment, please contact your pharmacy*  Follow-Up: Call back or seek an in-person evaluation if the symptoms worsen or if the condition fails to improve as anticipated.   Virtual Care [phone]  Other Instructions  -Take meds as prescribed -Rest -Use a cool mist humidifier especially during the winter months when heat dries out the air. - Use saline nose sprays frequently to help soothe nasal passages and promote drainage. -Saline irrigations of the nose can be very helpful if done frequently.             * 4X daily for 1 week*             * Use of a nettie pot can be helpful with this.  *Follow directions with this* *Boiled or distilled water only -stay hydrated by drinking plenty of fluids - Keep thermostat turn down low to prevent  drying out sinuses - For any cough or congestion- robitussin DM or Delsym as needed - For fever or aches or pains- take tylenol or ibuprofen as directed on bottle             * for fevers greater than 101 orally you may alternate ibuprofen and tylenol every 3 hours.  If you do not improve you will need a follow up visit in person.                Allergies can cause a lot of symptoms: watery, itching eyes, runny nose (clear), sneezing, sinus pressure, and headaches. This is not making you contagious to others. You can not spread to others or catch this from others. These symptoms happen after you have been exposed to something that you are allergic to an allergen. Prevention: The best prevention is to avoid the things that you know you are  allergic to, for example smoke (cigarette, cigar, wood); pollens and molds; animal dander; dust mites. And indoor inhalants such as cleaning products or aerosol sprays.  Target your bedroom as allergy free by removing carpets, damp mopping floors weekly, hanging washable curtains instead of blinds, removing books and stuffed animals, using foam pillows, and encasing pillows and mattress in plastic. Do not blow your nose too frequently or too hard. It may cause your eardrum to perforate (tear). Blow through both nostrils at the same time to equalize pressure.  Use tissue when you blow your nose. Dispose of them and then wash your hands. If no tissue is available, do the "elbow sneeze" into the bend of your arm (away from your open hands). Always wash your hands.  If able use the [facility] in the house and car to reduce exposure to pollens. Use an air filtration system in your house or buy a small one for your bedroom. Dust your house often, using a cloth and cleaner or polish that keeps the dust from flying into the air. Allergy testing can be done if you have had allergies for a long time and are not doing well on current treatments. Take your medications as directed. If you find the medications are not working let your healthcare provider know. It might take more than one medication to control allergies, especially, seasonal ones.     If you have been instructed to have an in-person evaluation today at a local Urgent Care facility, please use the link below. It will take you to a list of all of our available [facility], including address, phone number and hours of operation. Please do not delay care.  [facility]  If you or a family member do not have a primary care provider, use the link below to schedule a visit and establish care. When you choose a [facility] primary care physician or advanced practice provider, you gain a long-term partner in health. Find a Primary Care  Provider  Learn more about [facility]'s in-office and virtual care options: [facility] - Get Care Now

## 2023-04-03 NOTE — Progress Notes (Signed)
Virtual Visit Consent   Karen Santos, you are scheduled for a virtual visit with a [facility] provider today. Just as with appointments in the office, your consent must be obtained to participate. Your consent will be active for this visit and any virtual visit you may have with one of our providers in the next 365 days. If you have a MyChart account, a copy of this consent can be sent to you electronically.  As this is a virtual visit, video technology does not allow for your provider to perform a traditional examination. This may limit your provider's ability to fully assess your condition. If your provider identifies any concerns that need to be evaluated in person or the need to arrange testing (such as labs, EKG, etc.), we will make arrangements to do so. Although advances in technology are sophisticated, we cannot ensure that it will always work on either your end or our end. If the connection with a video visit is poor, the visit may have to be switched to a telephone visit. With either a video or telephone visit, we are not always able to ensure that we have a secure connection.  By engaging in this virtual visit, you consent to the provision of healthcare and authorize for your insurance to be billed (if applicable) for the services provided during this visit. Depending on your insurance coverage, you may receive a charge related to this service.  I need to obtain your verbal consent now. Are you willing to proceed with your visit today? Karen Santos has provided verbal consent on 04/03/2023 for a virtual visit (video or telephone). Freddy Finner, NP  Date: 04/03/2023 1:11 PM  Virtual Visit via Video Note   I, Freddy Finner, connected with  Karen Santos  ([id number], [id number]) on 04/03/23 at  1:00 PM EDT by a video-enabled telemedicine application and verified that I am speaking with the correct person using two identifiers.  Location: Patient: Virtual Visit Location Patient:  Home Provider: Virtual Visit Location Provider: Home Office   I discussed the limitations of evaluation and management by telemedicine and the availability of in person appointments. The patient expressed understanding and agreed to proceed.    History of Present Illness: Karen Santos is a 42 y.o. who identifies as a female who was assigned female at birth, and is being seen today for sinus congestion and sore throat.  Onset was two days ago (but she has some lyme disease, and the symptoms overlap- so could have been longer than a week) started sore throat Associated symptoms are cough, congestion, ear pain, 100.5 temp yesterday, chest tightness when feeling congestion. Modifying factors are day quil- limited relief Denies shortness of breath, fevers, chills  Exposure to sick contacts- unknown COVID test: no  Has bad allergies    Problems:  Patient Active Problem List   Diagnosis Date Noted   Encounter to establish care 09/27/2022   Prediabetes 09/27/2022   Morbid obesity 09/27/2022   Migraines 09/27/2022   Moderate episode of recurrent major depressive disorder 05/27/2019   Chest pain 04/25/2017   Numbness and tingling in both hands 09/25/2016   Allergic state 08/08/2016   Anemia 08/08/2016   Asthma without status asthmaticus 08/08/2016   Chronic insomnia 08/08/2016   Depression with anxiety 08/08/2016   Excessive daytime sleepiness 08/08/2016   Fibromyalgia 08/08/2016   GERD (gastroesophageal reflux disease) 08/08/2016   Carpal tunnel syndrome 02/24/2016   Generalized edema 02/04/2016   Mixed hyperlipidemia 02/04/2016  Shortness of breath 02/04/2016   Neuropathy 09/26/2015   Paresthesia and pain of both upper extremities 11/30/2014   Rotator cuff impingement syndrome of left shoulder 11/30/2014   Chronic diarrhea 09/29/2014   Back pain 04/25/2009    Allergies:  Allergies  Allergen Reactions   Codeine Rash   Duloxetine Nausea And Vomiting and Other (See  Comments)   Erythromycin Hives   Shellfish-Derived Products Rash   Medications:  Current Outpatient Medications:    albuterol (PROVENTIL HFA;VENTOLIN HFA) 108 (90 Base) MCG/ACT inhaler, Inhale 2 puffs into the lungs every 6 (six) hours as needed for wheezing or shortness of breath., Disp: 1 Inhaler, Rfl: 2   gabapentin (NEURONTIN) 300 MG capsule, Take 1 capsule (300 mg total) by mouth 3 (three) times daily., Disp: 90 capsule, Rfl: 3   topiramate (TOPAMAX) 25 MG tablet, Take 1 tablet (25 mg total) by mouth daily., Disp: 30 tablet, Rfl: 2  Observations/Objective: Patient is well-developed, well-nourished in no acute distress.  Resting comfortably  at home.  Head is normocephalic, atraumatic.  No labored breathing.  Speech is clear and coherent with logical content.  Patient is alert and oriented at baseline.    Assessment and Plan:  1. Acute non-recurrent sinusitis, unspecified location  - amoxicillin-clavulanate (AUGMENTIN) 875-125 MG tablet; Take 1 tablet by mouth 2 (two) times daily for 7 days.  Dispense: 14 tablet; Refill: 0 - cetirizine (ZYRTEC) 10 MG tablet; Take 1 tablet (10 mg total) by mouth daily.  Dispense: 30 tablet; Refill: 0 - fluticasone (FLONASE) 50 MCG/ACT nasal spray; Place 2 sprays into both nostrils daily.  Dispense: 16 g; Refill: 0  -Take meds as prescribed -Rest -Use a cool mist humidifier especially during the winter months when heat dries out the air. - Use saline nose sprays frequently to help soothe nasal passages and promote drainage. -Saline irrigations of the nose can be very helpful if done frequently.             * 4X daily for 1 week*             * Use of a nettie pot can be helpful with this.  *Follow directions with this* *Boiled or distilled water only -stay hydrated by drinking plenty of fluids - Keep thermostat turn down low to prevent drying out sinuses - For any cough or congestion- robitussin DM or Delsym as needed - For fever or aches or  pains- take tylenol or ibuprofen as directed on bottle             * for fevers greater than 101 orally you may alternate ibuprofen and tylenol every 3 hours.  If you do not improve you will need a follow up visit in person.               Reviewed side effects, risks and benefits of medication.    Patient acknowledged agreement and understanding of the plan.   Past Medical, Surgical, Social History, Allergies, and Medications have been Reviewed.     Follow Up Instructions: I discussed the assessment and treatment plan with the patient. The patient was provided an opportunity to ask questions and all were answered. The patient agreed with the plan and demonstrated an understanding of the instructions.  A copy of instructions were sent to the patient via MyChart unless otherwise noted below.    The patient was advised to call back or seek an in-person evaluation if the symptoms worsen or if the condition fails to improve as anticipated.  Time:  I spent 10 minutes with the patient via telehealth technology discussing the above problems/concerns.    Perlie Mayo, NP

## 2023-06-08 ENCOUNTER — Ambulatory Visit: Payer: Medicaid Other | Admitting: Nurse Practitioner

## 2024-04-13 ENCOUNTER — Telehealth: Admitting: Physician Assistant

## 2024-04-13 DIAGNOSIS — J069 Acute upper respiratory infection, unspecified: Secondary | ICD-10-CM

## 2024-04-13 MED ORDER — DOXYCYCLINE HYCLATE 100 MG PO CAPS: 100.0000 mg | ORAL_CAPSULE | Freq: Two times a day (BID) | ORAL | 0 refills | Status: AC

## 2024-04-13 MED ORDER — ONDANSETRON 4 MG PO TBDP: 4.0000 mg | ORAL_TABLET | Freq: Three times a day (TID) | ORAL | 0 refills | Status: AC | PRN

## 2024-04-13 MED ORDER — BENZONATATE 100 MG PO CAPS: ORAL_CAPSULE | ORAL | 0 refills | Status: AC

## 2024-04-13 NOTE — Patient Instructions (Signed)
 Karen Santos, thank you for joining Malcom Scriver, PA-C for today's virtual visit.  While this provider is not your primary care provider (PCP), if your PCP is located in our provider database this encounter information will be shared with them immediately following your visit.   A [facility] MyChart account gives you access to today's visit and all your visits, tests, and labs performed at [facility] " click here if you don't have a [facility] MyChart account or go to [email or web address]  Consent: (Patient) Karen Santos provided verbal consent for this virtual visit at the beginning of the encounter.  Current Medications:  Current Outpatient Medications:    benzonatate (TESSALON) 100 MG capsule, Take 1-2 caps PO TID PRN, Disp: 20 capsule, Rfl: 0   doxycycline (VIBRAMYCIN) 100 MG capsule, Take 1 capsule (100 mg total) by mouth 2 (two) times daily., Disp: 20 capsule, Rfl: 0   ondansetron (ZOFRAN-ODT) 4 MG disintegrating tablet, Take 1 tablet (4 mg total) by mouth every 8 (eight) hours as needed for nausea or vomiting., Disp: 20 tablet, Rfl: 0   albuterol  (PROVENTIL  HFA;VENTOLIN  HFA) 108 (90 Base) MCG/ACT inhaler, Inhale 2 puffs into the lungs every 6 (six) hours as needed for wheezing or shortness of breath., Disp: 1 Inhaler, Rfl: 2   cetirizine  (ZYRTEC ) 10 MG tablet, Take 1 tablet (10 mg total) by mouth daily., Disp: 30 tablet, Rfl: 0   fluticasone  (FLONASE ) 50 MCG/ACT nasal spray, Place 2 sprays into both nostrils daily., Disp: 16 g, Rfl: 0   gabapentin  (NEURONTIN ) 300 MG capsule, Take 1 capsule (300 mg total) by mouth 3 (three) times daily., Disp: 90 capsule, Rfl: 3   topiramate  (TOPAMAX ) 25 MG tablet, Take 1 tablet (25 mg total) by mouth daily., Disp: 30 tablet, Rfl: 2   Medications ordered in this encounter:  Meds ordered this encounter  Medications   doxycycline (VIBRAMYCIN) 100 MG capsule    Sig: Take 1 capsule (100 mg total) by mouth 2 (two) times daily.     Dispense:  20 capsule    Refill:  0    Supervising Provider:   LAMPTEY, PHILIP O [[id number]]   benzonatate (TESSALON) 100 MG capsule    Sig: Take 1-2 caps PO TID PRN    Dispense:  20 capsule    Refill:  0    Supervising Provider:   LAMPTEY, PHILIP O [[id number]]   ondansetron (ZOFRAN-ODT) 4 MG disintegrating tablet    Sig: Take 1 tablet (4 mg total) by mouth every 8 (eight) hours as needed for nausea or vomiting.    Dispense:  20 tablet    Refill:  0    Supervising Provider:   LAMPTEY, PHILIP O [[id number]]     *If you need refills on other medications prior to your next appointment, please contact your pharmacy*  Follow-Up: Call back or seek an in-person evaluation if the symptoms worsen or if the condition fails to improve as anticipated.  [facility] [phone]  Other Instructions Upper Respiratory Infection, Adult An upper respiratory infection (URI) is a common viral infection of the nose, throat, and upper air passages that lead to the lungs. The most common type of URI is the common cold. URIs usually get better on their own, without medical treatment. What are the causes? A URI is caused by a virus. You may catch a virus by: Breathing in droplets from an infected person's cough or sneeze. Touching something that has been exposed to the virus (is  contaminated) and then touching your mouth, nose, or eyes. What increases the risk? You are more likely to get a URI if: You are very young or very old. You have close contact with others, such as at work, school, or a health care facility. You smoke. You have long-term (chronic) heart or lung disease. You have a weakened disease-fighting system (immune system). You have nasal allergies or asthma. You are experiencing a lot of stress. You have poor nutrition. What are the signs or symptoms? A URI usually involves some of the following symptoms: Runny or stuffy (congested) nose. Cough. Sneezing. Sore  throat. Headache. Fatigue. Fever. Loss of appetite. Pain in your forehead, behind your eyes, and over your cheekbones (sinus pain). Muscle aches. Redness or irritation of the eyes. Pressure in the ears or face. How is this diagnosed? This condition may be diagnosed based on your medical history and symptoms, and a physical exam. Your health care provider may use a swab to take a mucus sample from your nose (nasal swab). This sample can be tested to determine what virus is causing the illness. How is this treated? URIs usually get better on their own within 7-10 days. Medicines cannot cure URIs, but your health care provider may recommend certain medicines to help relieve symptoms, such as: Over-the-counter cold medicines. Cough suppressants. Coughing is a type of defense against infection that helps to clear the respiratory system, so take these medicines only as recommended by your health care provider. Fever-reducing medicines. Follow these instructions at home: Activity Rest as needed. If you have a fever, stay home from work or school until your fever is gone or until your health care provider says your URI cannot spread to other people (is no longer contagious). Your health care provider may have you wear a face mask to prevent your infection from spreading. Relieving symptoms Gargle with a mixture of salt and water 3-4 times a day or as needed. To make salt water, completely dissolve -1 tsp (3-6 g) of salt in 1 cup (237 mL) of warm water. Use a cool-mist humidifier to add moisture to the air. This can help you breathe more easily. Eating and drinking  Drink enough fluid to keep your urine pale yellow. Eat soups and other clear broths. General instructions  Take over-the-counter and prescription medicines only as told by your health care provider. These include cold medicines, fever reducers, and cough suppressants. Do not use any products that contain nicotine or tobacco. These  products include cigarettes, chewing tobacco, and vaping devices, such as e-cigarettes. If you need help quitting, ask your health care provider. Stay away from secondhand smoke. Stay up to date on all immunizations, including the yearly (annual) flu vaccine. Keep all follow-up visits. This is important. How to prevent the spread of infection to others URIs can be contagious. To prevent the infection from spreading: Wash your hands with soap and water for at least 20 seconds. If soap and water are not available, use hand sanitizer. Avoid touching your mouth, face, eyes, or nose. Cough or sneeze into a tissue or your sleeve or elbow instead of into your hand or into the air.  Contact a health care provider if: You are getting worse instead of better. You have a fever or chills. Your mucus is brown or red. You have yellow or brown discharge coming from your nose. You have pain in your face, especially when you bend forward. You have swollen neck glands. You have pain while swallowing. You have  white areas in the back of your throat. Get help right away if: You have shortness of breath that gets worse. You have severe or persistent: Headache. Ear pain. Sinus pain. Chest pain. You have chronic lung disease along with any of the following: Making high-pitched whistling sounds when you breathe, most often when you breathe out (wheezing). Prolonged cough (more than 14 days). Coughing up blood. A change in your usual mucus. You have a stiff neck. You have changes in your: Vision. Hearing. Thinking. Mood. These symptoms may be an emergency. Get help right away. Call 911. Do not wait to see if the symptoms will go away. Do not drive yourself to the hospital. Summary An upper respiratory infection (URI) is a common infection of the nose, throat, and upper air passages that lead to the lungs. A URI is caused by a virus. URIs usually get better on their own within 7-10 days. Medicines  cannot cure URIs, but your health care provider may recommend certain medicines to help relieve symptoms. This information is not intended to replace advice given to you by your health care provider. Make sure you discuss any questions you have with your health care provider. Document Revised: 07/06/2021 Document Reviewed: 07/06/2021 Elsevier Patient Education  2024 [employer]  If you have been instructed to have an in-person evaluation today at a local Urgent Care facility, please use the link below. It will take you to a list of all of our available [facility], including address, phone number and hours of operation. Please do not delay care.  [facility]  If you or a family member do not have a primary care provider, use the link below to schedule a visit and establish care. When you choose a [facility] primary care physician or advanced practice provider, you gain a long-term partner in health. Find a Primary Care Provider  Learn more about [facility]'s in-office and virtual care options: [facility] - Get Care Now

## 2024-04-13 NOTE — Progress Notes (Signed)
 Virtual Visit Consent   Karen Santos, you are scheduled for a virtual visit with a [facility] provider today. Just as with appointments in the office, your consent must be obtained to participate. Your consent will be active for this visit and any virtual visit you may have with one of our providers in the next 365 days. If you have a MyChart account, a copy of this consent can be sent to you electronically.  As this is a virtual visit, video technology does not allow for your provider to perform a traditional examination. This may limit your provider's ability to fully assess your condition. If your provider identifies any concerns that need to be evaluated in person or the need to arrange testing (such as labs, EKG, etc.), we will make arrangements to do so. Although advances in technology are sophisticated, we cannot ensure that it will always work on either your end or our end. If the connection with a video visit is poor, the visit may have to be switched to a telephone visit. With either a video or telephone visit, we are not always able to ensure that we have a secure connection.  By engaging in this virtual visit, you consent to the provision of healthcare and authorize for your insurance to be billed (if applicable) for the services provided during this visit. Depending on your insurance coverage, you may receive a charge related to this service.  I need to obtain your verbal consent now. Are you willing to proceed with your visit today? Karen Santos has provided verbal consent on 04/13/2024 for a virtual visit (video or telephone). Karen Scriver, PA-C  Date: 04/13/2024 4:19 PM   Virtual Visit via Video Note   I, Karen Santos, connected with  Karen Santos  ([id number], Jan 15, 1981) on 04/13/[birth year] at  4:15 PM EDT by a video-enabled telemedicine application and verified that I am speaking with the correct person using two identifiers.  Location: Patient: Virtual Visit Location  Patient: Home Provider: Virtual Visit Location Provider: Home Office   I discussed the limitations of evaluation and management by telemedicine and the availability of in person appointments. The patient expressed understanding and agreed to proceed.    History of Present Illness: Karen Santos is a 43 y.o. who identifies as a female who was assigned female at birth,  with a history of Lyme disease, presents with a 3-day history of upper respiratory symptoms, including cough, fever, and chest tightness. She reports green sputum production and a measured fever of 101 degrees Fahrenheit. The patient has been unable to eat or drink since the previous day. She has tried over-the-counter remedies, including Tussin and Theraflu, without relief. The patient's grandchildren were recently seen at an urgent care center for similar symptoms and tested negative for COVID-19, influenza A, and influenza B. The patient has not taken a home COVID-19 test. . The patient also experienced nausea.  Problems:  Patient Active Problem List   Diagnosis Date Noted   Encounter to establish care 09/27/2022   Prediabetes 09/27/2022   Morbid obesity (HCC) 09/27/2022   Migraines 09/27/2022   Moderate episode of recurrent major depressive disorder (HCC) 05/27/2019   Chest pain 04/25/2017   Numbness and tingling in both hands 09/25/2016   Allergy 08/08/2016   Anemia 08/08/2016   Asthma without status asthmaticus 08/08/2016   Chronic insomnia 08/08/2016   Depression with anxiety 08/08/2016   Excessive daytime sleepiness 08/08/2016   Fibromyalgia 08/08/2016   GERD (gastroesophageal reflux  disease) 08/08/2016   Carpal tunnel syndrome 02/24/2016   Generalized edema 02/04/2016   Mixed hyperlipidemia 02/04/2016   Shortness of breath 02/04/2016   Neuropathy 09/26/2015   Paresthesia and pain of both upper extremities 11/30/2014   Rotator cuff impingement syndrome of left shoulder 11/30/2014   Chronic diarrhea 09/29/2014    Back pain 04/25/2009    Allergies:  Allergies  Allergen Reactions   Codeine Rash   Duloxetine Nausea And Vomiting and Other (See Comments)   Erythromycin Hives   Shellfish-Derived Products Rash   Medications:  Current Outpatient Medications:    benzonatate (TESSALON) 100 MG capsule, Take 1-2 caps PO TID PRN, Disp: 20 capsule, Rfl: 0   doxycycline (VIBRAMYCIN) 100 MG capsule, Take 1 capsule (100 mg total) by mouth 2 (two) times daily., Disp: 20 capsule, Rfl: 0   ondansetron (ZOFRAN-ODT) 4 MG disintegrating tablet, Take 1 tablet (4 mg total) by mouth every 8 (eight) hours as needed for nausea or vomiting., Disp: 20 tablet, Rfl: 0   albuterol  (PROVENTIL  HFA;VENTOLIN  HFA) 108 (90 Base) MCG/ACT inhaler, Inhale 2 puffs into the lungs every 6 (six) hours as needed for wheezing or shortness of breath., Disp: 1 Inhaler, Rfl: 2   cetirizine  (ZYRTEC ) 10 MG tablet, Take 1 tablet (10 mg total) by mouth daily., Disp: 30 tablet, Rfl: 0   fluticasone  (FLONASE ) 50 MCG/ACT nasal spray, Place 2 sprays into both nostrils daily., Disp: 16 g, Rfl: 0   gabapentin  (NEURONTIN ) 300 MG capsule, Take 1 capsule (300 mg total) by mouth 3 (three) times daily., Disp: 90 capsule, Rfl: 3   topiramate  (TOPAMAX ) 25 MG tablet, Take 1 tablet (25 mg total) by mouth daily., Disp: 30 tablet, Rfl: 2  Observations/Objective: Patient is well-developed, well-nourished in no acute distress.  Resting comfortably  at home.  Head is normocephalic, atraumatic.  No labored breathing.  Speech is clear and coherent with logical content.  Patient is alert and oriented at baseline.    Assessment and Plan: 1. Upper respiratory tract infection, unspecified type (Primary) - doxycycline (VIBRAMYCIN) 100 MG capsule; Take 1 capsule (100 mg total) by mouth 2 (two) times daily.  Dispense: 20 capsule; Refill: 0 - benzonatate (TESSALON) 100 MG capsule; Take 1-2 caps PO TID PRN  Dispense: 20 capsule; Refill: 0 - ondansetron (ZOFRAN-ODT) 4 MG  disintegrating tablet; Take 1 tablet (4 mg total) by mouth every 8 (eight) hours as needed for nausea or vomiting.  Dispense: 20 tablet; Refill: 0   Upper respiratory infection Acute infection with cough, fever, chest tightness, and green sputum.  - Prescribed doxycycline,  - Encouraged increased fluid intake. .- Prescribed anti-nausea medication.  Follow Up Instructions: I discussed the assessment and treatment plan with the patient. The patient was provided an opportunity to ask questions and all were answered. The patient agreed with the plan and demonstrated an understanding of the instructions.  A copy of instructions were sent to the patient via MyChart unless otherwise noted below.     The patient was advised to call back or seek an in-person evaluation if the symptoms worsen or if the condition fails to improve as anticipated.    Etter Hermann Mayers, PA-C

## 2024-04-19 ENCOUNTER — Other Ambulatory Visit: Payer: Self-pay | Admitting: Physician Assistant

## 2024-04-19 DIAGNOSIS — J069 Acute upper respiratory infection, unspecified: Secondary | ICD-10-CM

## 2024-08-01 ENCOUNTER — Telehealth: Admitting: Family Medicine

## 2024-08-01 ENCOUNTER — Encounter

## 2024-08-01 DIAGNOSIS — M797 Fibromyalgia: Secondary | ICD-10-CM

## 2024-08-01 DIAGNOSIS — F418 Other specified anxiety disorders: Secondary | ICD-10-CM

## 2024-08-01 DIAGNOSIS — G43709 Chronic migraine without aura, not intractable, without status migrainosus: Secondary | ICD-10-CM | POA: Diagnosis not present

## 2024-08-01 NOTE — Patient Instructions (Signed)
Myofascial Pain Syndrome and Fibromyalgia Myofascial pain syndrome and fibromyalgia are both pain disorders. You may feel this pain mainly in your muscles. Myofascial pain syndrome: Always has tender points in the muscles that will cause pain when pressed (trigger points). The pain may come and go. Usually affects your neck, upper back, and shoulder areas. The pain often moves into your arms and hands. Fibromyalgia: Has muscle pains and tenderness that come and go. Is often associated with tiredness (fatigue) and sleep problems. Has trigger points. Tends to be long-lasting (chronic), but is not life-threatening. Fibromyalgia and myofascial pain syndrome are not the same. However, they often occur together. If you have both conditions, each can make the other worse. Both are common and can cause enough pain and fatigue to make day-to-day activities difficult. Both can be hard to diagnose because their symptoms are common in many other conditions. What are the causes? The exact causes of these conditions are not known. What increases the risk? You are more likely to develop either of these conditions if: You have a family history of the condition. You are female. You have certain triggers, such as: Spine disorders. An injury (trauma) or other physical stressors. Being under a lot of stress. Medical conditions such as osteoarthritis, rheumatoid arthritis, or lupus. What are the signs or symptoms? Fibromyalgia The main symptom of fibromyalgia is widespread pain and tenderness in your muscles. Pain is sometimes described as stabbing, shooting, or burning. You may also have: Tingling or numbness. Sleep problems and fatigue. Problems with attention and concentration (fibro fog). Other symptoms may include: Bowel and bladder problems. Headaches. Vision problems. Sensitivity to odors and noises. Depression or mood changes. Painful menstrual periods (dysmenorrhea). Dry skin or eyes. These  symptoms can vary over time. Myofascial pain syndrome Symptoms of myofascial pain syndrome include: Tight, ropy bands of muscle. Uncomfortable sensations in muscle areas. These may include aching, cramping, burning, numbness, tingling, and weakness. Difficulty moving certain parts of the body freely (poor range of motion). How is this diagnosed? This condition may be diagnosed by your symptoms and medical history. You will also have a physical exam. In general: Fibromyalgia is diagnosed if you have pain, fatigue, and other symptoms for more than 3 months, and symptoms cannot be explained by another condition. Myofascial pain syndrome is diagnosed if you have trigger points in your muscles, and those trigger points are tender and cause pain elsewhere in your body (referred pain). How is this treated? Treatment for these conditions depends on the type that you have. For fibromyalgia, a healthy lifestyle is the most important treatment including aerobic and strength exercises. Different types of medicines are used to help treat pain and include: NSAIDs. Medicines for treating depression. Medicines that help control seizures. Medicines that relax the muscles. Treatment for myofascial pain syndrome includes: Pain medicines, such as NSAIDs. Cooling and stretching of muscles. Massage therapy with myofascial release technique. Trigger point injections. Treating these conditions often requires a team of health care providers. These may include: Your primary care provider. A physical therapist. Complementary health care providers, such as massage therapists or acupuncturists. A psychiatrist for cognitive behavioral therapy. Follow these instructions at home: Medicines Take over-the-counter and prescription medicines only as told by your health care provider. Ask your health care provider if the medicine prescribed to you: Requires you to avoid driving or using machinery. Can cause constipation.  You may need to take these actions to prevent or treat constipation: Drink enough fluid to keep your urine pale   yellow. Take over-the-counter or prescription medicines. Eat foods that are high in fiber, such as beans, whole grains, and fresh fruits and vegetables. Limit foods that are high in fat and processed sugars, such as fried or sweet foods. Lifestyle  Do exercises as told by your health care provider or physical therapist. Practice relaxation techniques to control your stress. You may want to try: Biofeedback. Visual imagery. Hypnosis. Muscle relaxation. Yoga. Meditation. Maintain a healthy lifestyle. This includes eating a healthy diet and getting enough sleep. Do not use any products that contain nicotine or tobacco. These products include cigarettes, chewing tobacco, and vaping devices, such as e-cigarettes. If you need help quitting, ask your health care provider. General instructions Talk to your health care provider about complementary treatments, such as acupuncture or massage. Do not do activities that stress or strain your muscles. This includes repetitive motions and heavy lifting. Keep all follow-up visits. This is important. Where to find support Consider joining a support group with others who are diagnosed with this condition. National Fibromyalgia Association: fmaware.org Where to find more information U.S. Pain Foundation: uspainfoundation.org Contact a health care provider if: You have new symptoms. Your symptoms get worse or your pain is severe. You have side effects from your medicines. You have trouble sleeping. Your condition is causing depression or anxiety. Get help right away if: You have thoughts of hurting yourself or others. Get help right away if you feel like you may hurt yourself or others, or have thoughts about taking your own life. Go to your nearest emergency room or: Call 911. Call the National Suicide Prevention Lifeline at 1-800-273-8255  or 988. This is open 24 hours a day. Text the Crisis Text Line at 741741. This information is not intended to replace advice given to you by your health care provider. Make sure you discuss any questions you have with your health care provider. Document Revised: 09/11/2022 Document Reviewed: 11/04/2021 Elsevier Patient Education  2024 Elsevier Inc.  

## 2024-08-01 NOTE — Progress Notes (Signed)
   Thank you for the details you included in the comment boxes. Those details are very helpful in determining the best course of treatment for you and help us  to provide the best care. We recommend that you schedule a Virtual Urgent Care video visit in order for the provider to better assess what is going on.  The provider will be able to give you a more accurate diagnosis and treatment plan if we can more freely discuss your symptoms and with the addition of a virtual examination.   If you change your visit to a video visit, we will bill your insurance (similar to an office visit) and you will not be charged for this e-Visit. You will be able to stay at home and speak with the first available [facility] advanced practice provider. The link to do a video visit is in the drop down Menu tab of your Welcome screen in MyChart.

## 2024-08-01 NOTE — Progress Notes (Signed)
 Virtual Visit Consent   Karen Santos, you are scheduled for a virtual visit with a [facility] provider today. Just as with appointments in the office, your consent must be obtained to participate. Your consent will be active for this visit and any virtual visit you may have with one of our providers in the next 365 days. If you have a MyChart account, a copy of this consent can be sent to you electronically.  As this is a virtual visit, video technology does not allow for your provider to perform a traditional examination. This may limit your provider's ability to fully assess your condition. If your provider identifies any concerns that need to be evaluated in person or the need to arrange testing (such as labs, EKG, etc.), we will make arrangements to do so. Although advances in technology are sophisticated, we cannot ensure that it will always work on either your end or our end. If the connection with a video visit is poor, the visit may have to be switched to a telephone visit. With either a video or telephone visit, we are not always able to ensure that we have a secure connection.  By engaging in this virtual visit, you consent to the provision of healthcare and authorize for your insurance to be billed (if applicable) for the services provided during this visit. Depending on your insurance coverage, you may receive a charge related to this service.  I need to obtain your verbal consent now. Are you willing to proceed with your visit today? Karen Santos has provided verbal consent on 08/01/2024 for a virtual visit (video or telephone). Loa Lamp, FNP  Date: 08/01/2024 3:03 PM   Virtual Visit via Video Note   I, Loa Lamp, connected with  Karen Santos  ([id number], [birth year]) on 08/01/24 at  3:00 PM EDT by a video-enabled telemedicine application and verified that I am speaking with the correct person using two identifiers.  Location: Patient: Virtual Visit Location Patient:  Home Provider: Virtual Visit Location Provider: Home Office   I discussed the limitations of evaluation and management by telemedicine and the availability of in person appointments. The patient expressed understanding and agreed to proceed.    History of Present Illness: Karen Santos is a 43 y.o. who identifies as a female who was assigned female at birth, and is being seen today for fibromyalgia pain, headaches and depression. She has not been on meds in a while and does not have a pcp .  She has been on Gabapentin , imitrex, and unknown antidepressant.   HPI: HPI  Problems:  Patient Active Problem List   Diagnosis Date Noted   Encounter to establish care 09/27/2022   Prediabetes 09/27/2022   Morbid obesity (HCC) 09/27/2022   Migraines 09/27/2022   Moderate episode of recurrent major depressive disorder (HCC) 05/27/2019   Chest pain 04/25/2017   Numbness and tingling in both hands 09/25/2016   Allergy 08/08/2016   Anemia 08/08/2016   Asthma without status asthmaticus 08/08/2016   Chronic insomnia 08/08/2016   Depression with anxiety 08/08/2016   Excessive daytime sleepiness 08/08/2016   Fibromyalgia 08/08/2016   GERD (gastroesophageal reflux disease) 08/08/2016   Carpal tunnel syndrome 02/24/2016   Generalized edema 02/04/2016   Mixed hyperlipidemia 02/04/2016   Shortness of breath 02/04/2016   Neuropathy 09/26/2015   Paresthesia and pain of both upper extremities 11/30/2014   Rotator cuff impingement syndrome of left shoulder 11/30/2014   Chronic diarrhea 09/29/2014   Back pain 04/25/2009  Allergies:  Allergies  Allergen Reactions   Codeine Rash   Duloxetine Nausea And Vomiting and Other (See Comments)   Erythromycin Hives   Shellfish-Derived Products Rash   Medications:  Current Outpatient Medications:    albuterol  (PROVENTIL  HFA;VENTOLIN  HFA) 108 (90 Base) MCG/ACT inhaler, Inhale 2 puffs into the lungs every 6 (six) hours as needed for wheezing or shortness  of breath., Disp: 1 Inhaler, Rfl: 2   benzonatate  (TESSALON ) 100 MG capsule, Take 1-2 caps PO TID PRN, Disp: 20 capsule, Rfl: 0   cetirizine  (ZYRTEC ) 10 MG tablet, Take 1 tablet (10 mg total) by mouth daily., Disp: 30 tablet, Rfl: 0   doxycycline  (VIBRAMYCIN ) 100 MG capsule, Take 1 capsule (100 mg total) by mouth 2 (two) times daily., Disp: 20 capsule, Rfl: 0   fluticasone  (FLONASE ) 50 MCG/ACT nasal spray, Place 2 sprays into both nostrils daily., Disp: 16 g, Rfl: 0   gabapentin  (NEURONTIN ) 300 MG capsule, Take 1 capsule (300 mg total) by mouth 3 (three) times daily., Disp: 90 capsule, Rfl: 3   ondansetron  (ZOFRAN -ODT) 4 MG disintegrating tablet, Take 1 tablet (4 mg total) by mouth every 8 (eight) hours as needed for nausea or vomiting., Disp: 20 tablet, Rfl: 0   topiramate  (TOPAMAX ) 25 MG tablet, Take 1 tablet (25 mg total) by mouth daily., Disp: 30 tablet, Rfl: 2  Observations/Objective: Patient is well-developed, well-nourished in no acute distress.  Resting comfortably  at home.  Head is normocephalic, atraumatic.  No labored breathing.  Speech is clear and coherent with logical content.  Patient is alert and oriented at baseline.    Assessment and Plan: 1. Fibromyalgia (Primary)  2. Chronic migraine without aura without status migrainosus, not intractable  3. Depression with anxiety  Discussed the limitations of this venue and she will need to get apptmt with pcp online to start new meds. She verbalizes understanding.   Follow Up Instructions: I discussed the assessment and treatment plan with the patient. The patient was provided an opportunity to ask questions and all were answered. The patient agreed with the plan and demonstrated an understanding of the instructions.  A copy of instructions were sent to the patient via MyChart unless otherwise noted below.     The patient was advised to call back or seek an in-person evaluation if the symptoms worsen or if the condition fails  to improve as anticipated.    Athina Fahey, FNP
